# Patient Record
Sex: Female | Born: 1940 | Race: White | Hispanic: No | Smoking: Never smoker
Health system: Southern US, Community
[De-identification: ages and names within clinical notes are randomized; demographics above are authoritative.]

## PROBLEM LIST (undated history)

## (undated) DIAGNOSIS — M47816 Spondylosis without myelopathy or radiculopathy, lumbar region: Secondary | ICD-10-CM

## (undated) DIAGNOSIS — F419 Anxiety disorder, unspecified: Secondary | ICD-10-CM

## (undated) DIAGNOSIS — B019 Varicella without complication: Secondary | ICD-10-CM

## (undated) DIAGNOSIS — R202 Paresthesia of skin: Secondary | ICD-10-CM

## (undated) DIAGNOSIS — G629 Polyneuropathy, unspecified: Secondary | ICD-10-CM

## (undated) DIAGNOSIS — S76319A Strain of muscle, fascia and tendon of the posterior muscle group at thigh level, unspecified thigh, initial encounter: Secondary | ICD-10-CM

## (undated) DIAGNOSIS — L989 Disorder of the skin and subcutaneous tissue, unspecified: Secondary | ICD-10-CM

## (undated) DIAGNOSIS — M7122 Synovial cyst of popliteal space [Baker], left knee: Secondary | ICD-10-CM

## (undated) DIAGNOSIS — T7840XA Allergy, unspecified, initial encounter: Secondary | ICD-10-CM

## (undated) DIAGNOSIS — M1990 Unspecified osteoarthritis, unspecified site: Secondary | ICD-10-CM

## (undated) DIAGNOSIS — D369 Benign neoplasm, unspecified site: Secondary | ICD-10-CM

## (undated) DIAGNOSIS — R011 Cardiac murmur, unspecified: Secondary | ICD-10-CM

## (undated) DIAGNOSIS — N189 Chronic kidney disease, unspecified: Secondary | ICD-10-CM

## (undated) DIAGNOSIS — C4492 Squamous cell carcinoma of skin, unspecified: Secondary | ICD-10-CM

## (undated) DIAGNOSIS — N200 Calculus of kidney: Secondary | ICD-10-CM

## (undated) DIAGNOSIS — N261 Atrophy of kidney (terminal): Secondary | ICD-10-CM

## (undated) DIAGNOSIS — K5900 Constipation, unspecified: Secondary | ICD-10-CM

## (undated) DIAGNOSIS — Q615 Medullary cystic kidney: Secondary | ICD-10-CM

## (undated) DIAGNOSIS — M8588 Other specified disorders of bone density and structure, other site: Secondary | ICD-10-CM

## (undated) DIAGNOSIS — K635 Polyp of colon: Secondary | ICD-10-CM

## (undated) DIAGNOSIS — G459 Transient cerebral ischemic attack, unspecified: Secondary | ICD-10-CM

## (undated) DIAGNOSIS — G4700 Insomnia, unspecified: Secondary | ICD-10-CM

## (undated) DIAGNOSIS — E079 Disorder of thyroid, unspecified: Secondary | ICD-10-CM

## (undated) DIAGNOSIS — M797 Fibromyalgia: Secondary | ICD-10-CM

## (undated) HISTORY — DX: Calculus of kidney: N20.0

## (undated) HISTORY — DX: Synovial cyst of popliteal space (Baker), left knee: M71.22

## (undated) HISTORY — DX: Constipation, unspecified: K59.00

## (undated) HISTORY — DX: Polyp of colon: K63.5

## (undated) HISTORY — DX: Fibromyalgia: M79.7

## (undated) HISTORY — DX: Unspecified osteoarthritis, unspecified site: M19.90

## (undated) HISTORY — PX: VEIN LIGATION: SHX2652

## (undated) HISTORY — DX: Paresthesia of skin: R20.2

## (undated) HISTORY — PX: COLONOSCOPY: SHX174

## (undated) HISTORY — DX: Anxiety disorder, unspecified: F41.9

## (undated) HISTORY — DX: Spondylosis without myelopathy or radiculopathy, lumbar region: M47.816

## (undated) HISTORY — PX: DILATION AND CURETTAGE OF UTERUS: SHX78

## (undated) HISTORY — DX: Varicella without complication: B01.9

## (undated) HISTORY — PX: EYE SURGERY: SHX253

## (undated) HISTORY — DX: Allergy, unspecified, initial encounter: T78.40XA

## (undated) HISTORY — DX: Disorder of thyroid, unspecified: E07.9

## (undated) HISTORY — DX: Other specified disorders of bone density and structure, other site: M85.88

## (undated) HISTORY — DX: Polyneuropathy, unspecified: G62.9

## (undated) HISTORY — DX: Strain of muscle, fascia and tendon of the posterior muscle group at thigh level, unspecified thigh, initial encounter: S76.319A

## (undated) HISTORY — DX: Atrophy of kidney (terminal): N26.1

## (undated) HISTORY — DX: Medullary cystic kidney: Q61.5

## (undated) HISTORY — DX: Squamous cell carcinoma of skin, unspecified: C44.92

## (undated) HISTORY — DX: Cardiac murmur, unspecified: R01.1

## (undated) HISTORY — PX: AUGMENTATION MAMMAPLASTY: SUR837

## (undated) HISTORY — DX: Transient cerebral ischemic attack, unspecified: G45.9

## (undated) HISTORY — DX: Disorder of the skin and subcutaneous tissue, unspecified: L98.9

## (undated) HISTORY — DX: Benign neoplasm, unspecified site: D36.9

---

## 1997-12-01 ENCOUNTER — Other Ambulatory Visit: Admission: RE | Admit: 1997-12-01 | Discharge: 1997-12-01 | Payer: Self-pay | Admitting: *Deleted

## 1999-01-22 ENCOUNTER — Ambulatory Visit (HOSPITAL_COMMUNITY): Admission: RE | Admit: 1999-01-22 | Discharge: 1999-01-22 | Payer: Self-pay | Admitting: Neurosurgery

## 1999-01-22 ENCOUNTER — Encounter: Payer: Self-pay | Admitting: Neurosurgery

## 1999-03-13 ENCOUNTER — Other Ambulatory Visit: Admission: RE | Admit: 1999-03-13 | Discharge: 1999-03-13 | Payer: Self-pay | Admitting: *Deleted

## 1999-03-28 ENCOUNTER — Encounter: Payer: Self-pay | Admitting: Neurosurgery

## 1999-03-28 ENCOUNTER — Ambulatory Visit (HOSPITAL_COMMUNITY): Admission: RE | Admit: 1999-03-28 | Discharge: 1999-03-28 | Payer: Self-pay | Admitting: Neurosurgery

## 1999-04-11 ENCOUNTER — Encounter: Payer: Self-pay | Admitting: Neurosurgery

## 1999-04-11 ENCOUNTER — Ambulatory Visit (HOSPITAL_COMMUNITY): Admission: RE | Admit: 1999-04-11 | Discharge: 1999-04-11 | Payer: Self-pay | Admitting: Neurosurgery

## 1999-04-27 ENCOUNTER — Ambulatory Visit (HOSPITAL_COMMUNITY): Admission: RE | Admit: 1999-04-27 | Discharge: 1999-04-27 | Payer: Self-pay | Admitting: Neurosurgery

## 1999-04-27 ENCOUNTER — Encounter: Payer: Self-pay | Admitting: Neurosurgery

## 1999-11-06 ENCOUNTER — Other Ambulatory Visit: Admission: RE | Admit: 1999-11-06 | Discharge: 1999-11-06 | Payer: Self-pay | Admitting: Internal Medicine

## 1999-11-15 ENCOUNTER — Encounter: Admission: RE | Admit: 1999-11-15 | Discharge: 1999-11-15 | Payer: Self-pay | Admitting: Family Medicine

## 1999-11-15 ENCOUNTER — Encounter: Payer: Self-pay | Admitting: Internal Medicine

## 2000-04-18 ENCOUNTER — Encounter: Payer: Self-pay | Admitting: Internal Medicine

## 2000-04-18 ENCOUNTER — Encounter: Admission: RE | Admit: 2000-04-18 | Discharge: 2000-04-18 | Payer: Self-pay | Admitting: Internal Medicine

## 2000-04-23 ENCOUNTER — Encounter: Payer: Self-pay | Admitting: Internal Medicine

## 2000-04-23 ENCOUNTER — Encounter: Admission: RE | Admit: 2000-04-23 | Discharge: 2000-04-23 | Payer: Self-pay | Admitting: Internal Medicine

## 2001-02-12 ENCOUNTER — Other Ambulatory Visit: Admission: RE | Admit: 2001-02-12 | Discharge: 2001-02-12 | Payer: Self-pay | Admitting: Family Medicine

## 2001-04-02 ENCOUNTER — Ambulatory Visit (HOSPITAL_COMMUNITY): Admission: RE | Admit: 2001-04-02 | Discharge: 2001-04-02 | Payer: Self-pay | Admitting: Gastroenterology

## 2001-04-02 ENCOUNTER — Encounter (INDEPENDENT_AMBULATORY_CARE_PROVIDER_SITE_OTHER): Payer: Self-pay | Admitting: Specialist

## 2003-05-18 ENCOUNTER — Encounter: Admission: RE | Admit: 2003-05-18 | Discharge: 2003-05-18 | Payer: Self-pay | Admitting: Internal Medicine

## 2004-02-21 ENCOUNTER — Encounter: Admission: RE | Admit: 2004-02-21 | Discharge: 2004-02-21 | Payer: Self-pay | Admitting: Family Medicine

## 2004-03-08 ENCOUNTER — Encounter (HOSPITAL_COMMUNITY): Admission: RE | Admit: 2004-03-08 | Discharge: 2004-06-06 | Payer: Self-pay | Admitting: Family Medicine

## 2004-03-23 ENCOUNTER — Ambulatory Visit (HOSPITAL_COMMUNITY): Admission: RE | Admit: 2004-03-23 | Discharge: 2004-03-23 | Payer: Self-pay | Admitting: Family Medicine

## 2004-03-23 ENCOUNTER — Encounter (INDEPENDENT_AMBULATORY_CARE_PROVIDER_SITE_OTHER): Payer: Self-pay | Admitting: *Deleted

## 2004-04-12 ENCOUNTER — Ambulatory Visit: Payer: Self-pay

## 2005-11-22 ENCOUNTER — Encounter: Admission: RE | Admit: 2005-11-22 | Discharge: 2005-11-22 | Payer: Self-pay | Admitting: Family Medicine

## 2005-11-28 ENCOUNTER — Encounter: Admission: RE | Admit: 2005-11-28 | Discharge: 2005-11-28 | Payer: Self-pay | Admitting: Family Medicine

## 2006-03-13 ENCOUNTER — Ambulatory Visit (HOSPITAL_COMMUNITY): Admission: RE | Admit: 2006-03-13 | Discharge: 2006-03-13 | Payer: Self-pay | Admitting: Neurosurgery

## 2007-01-14 ENCOUNTER — Encounter: Admission: RE | Admit: 2007-01-14 | Discharge: 2007-01-14 | Payer: Self-pay | Admitting: Family Medicine

## 2007-01-27 ENCOUNTER — Encounter: Admission: RE | Admit: 2007-01-27 | Discharge: 2007-01-27 | Payer: Self-pay | Admitting: Family Medicine

## 2008-03-03 ENCOUNTER — Encounter: Admission: RE | Admit: 2008-03-03 | Discharge: 2008-03-03 | Payer: Self-pay | Admitting: Family Medicine

## 2010-06-10 ENCOUNTER — Encounter: Payer: Self-pay | Admitting: Family Medicine

## 2010-06-12 ENCOUNTER — Encounter: Payer: Self-pay | Admitting: Family Medicine

## 2010-11-24 ENCOUNTER — Other Ambulatory Visit: Payer: Self-pay | Admitting: Internal Medicine

## 2010-11-24 DIAGNOSIS — Z1231 Encounter for screening mammogram for malignant neoplasm of breast: Secondary | ICD-10-CM

## 2010-12-06 ENCOUNTER — Ambulatory Visit
Admission: RE | Admit: 2010-12-06 | Discharge: 2010-12-06 | Disposition: A | Discharge status: Home / Self Care | Payer: Medicare Other | Source: Ambulatory Visit | Attending: Internal Medicine | Admitting: Internal Medicine

## 2010-12-06 DIAGNOSIS — Z1231 Encounter for screening mammogram for malignant neoplasm of breast: Secondary | ICD-10-CM

## 2011-01-25 ENCOUNTER — Other Ambulatory Visit: Payer: Self-pay | Admitting: Dermatology

## 2011-12-14 ENCOUNTER — Other Ambulatory Visit: Payer: Self-pay | Admitting: Internal Medicine

## 2011-12-14 DIAGNOSIS — Z9882 Breast implant status: Secondary | ICD-10-CM

## 2011-12-14 DIAGNOSIS — Z1231 Encounter for screening mammogram for malignant neoplasm of breast: Secondary | ICD-10-CM

## 2012-01-09 ENCOUNTER — Ambulatory Visit
Admission: RE | Admit: 2012-01-09 | Discharge: 2012-01-09 | Disposition: A | Discharge status: Home / Self Care | Payer: Medicare Other | Source: Ambulatory Visit | Attending: Internal Medicine | Admitting: Internal Medicine

## 2012-01-09 DIAGNOSIS — Z9882 Breast implant status: Secondary | ICD-10-CM

## 2012-01-09 DIAGNOSIS — Z1231 Encounter for screening mammogram for malignant neoplasm of breast: Secondary | ICD-10-CM

## 2012-03-25 ENCOUNTER — Other Ambulatory Visit: Payer: Self-pay | Admitting: Dermatology

## 2012-11-27 ENCOUNTER — Other Ambulatory Visit: Payer: Self-pay

## 2012-11-27 ENCOUNTER — Other Ambulatory Visit: Payer: Self-pay | Admitting: Internal Medicine

## 2012-11-27 DIAGNOSIS — Z1231 Encounter for screening mammogram for malignant neoplasm of breast: Secondary | ICD-10-CM

## 2013-01-26 ENCOUNTER — Encounter (HOSPITAL_BASED_OUTPATIENT_CLINIC_OR_DEPARTMENT_OTHER): Payer: Self-pay | Admitting: *Deleted

## 2013-01-27 ENCOUNTER — Encounter (HOSPITAL_BASED_OUTPATIENT_CLINIC_OR_DEPARTMENT_OTHER): Payer: Self-pay | Admitting: Orthopedic Surgery

## 2013-01-27 ENCOUNTER — Encounter (HOSPITAL_BASED_OUTPATIENT_CLINIC_OR_DEPARTMENT_OTHER): Payer: Self-pay | Admitting: Anesthesiology

## 2013-01-27 ENCOUNTER — Ambulatory Visit (HOSPITAL_BASED_OUTPATIENT_CLINIC_OR_DEPARTMENT_OTHER): Payer: Medicare Other | Admitting: Anesthesiology

## 2013-01-27 ENCOUNTER — Ambulatory Visit (HOSPITAL_BASED_OUTPATIENT_CLINIC_OR_DEPARTMENT_OTHER)
Admission: RE | Admit: 2013-01-27 | Discharge: 2013-01-27 | Disposition: A | Discharge status: Home / Self Care | Payer: Medicare Other | Source: Ambulatory Visit | Attending: Orthopedic Surgery | Admitting: Orthopedic Surgery

## 2013-01-27 ENCOUNTER — Encounter (HOSPITAL_BASED_OUTPATIENT_CLINIC_OR_DEPARTMENT_OTHER)
Admission: RE | Disposition: A | Discharge status: Home / Self Care | Payer: Self-pay | Source: Ambulatory Visit | Attending: Orthopedic Surgery

## 2013-01-27 DIAGNOSIS — X58XXXA Exposure to other specified factors, initial encounter: Secondary | ICD-10-CM | POA: Insufficient documentation

## 2013-01-27 DIAGNOSIS — Z79899 Other long term (current) drug therapy: Secondary | ICD-10-CM | POA: Insufficient documentation

## 2013-01-27 DIAGNOSIS — S61209A Unspecified open wound of unspecified finger without damage to nail, initial encounter: Secondary | ICD-10-CM | POA: Insufficient documentation

## 2013-01-27 DIAGNOSIS — IMO0002 Reserved for concepts with insufficient information to code with codable children: Secondary | ICD-10-CM | POA: Insufficient documentation

## 2013-01-27 DIAGNOSIS — Z1839 Other retained organic fragments: Secondary | ICD-10-CM | POA: Insufficient documentation

## 2013-01-27 DIAGNOSIS — M129 Arthropathy, unspecified: Secondary | ICD-10-CM | POA: Insufficient documentation

## 2013-01-27 DIAGNOSIS — G4700 Insomnia, unspecified: Secondary | ICD-10-CM | POA: Insufficient documentation

## 2013-01-27 DIAGNOSIS — Y929 Unspecified place or not applicable: Secondary | ICD-10-CM | POA: Insufficient documentation

## 2013-01-27 DIAGNOSIS — Q615 Medullary cystic kidney: Secondary | ICD-10-CM | POA: Insufficient documentation

## 2013-01-27 HISTORY — DX: Insomnia, unspecified: G47.00

## 2013-01-27 HISTORY — PX: INCISION AND DRAINAGE ABSCESS: SHX5864

## 2013-01-27 HISTORY — DX: Unspecified osteoarthritis, unspecified site: M19.90

## 2013-01-27 HISTORY — DX: Chronic kidney disease, unspecified: N18.9

## 2013-01-27 LAB — POCT HEMOGLOBIN-HEMACUE: Hemoglobin: 13.6 g/dL (ref 12.0–15.0)

## 2013-01-27 SURGERY — INCISION AND DRAINAGE, ABSCESS
Anesthesia: Regional | Site: Thumb | Laterality: Right | Wound class: Dirty or Infected

## 2013-01-27 MED ORDER — ONDANSETRON HCL 4 MG/2ML IJ SOLN: 4.0000 mg | Freq: Once | INTRAMUSCULAR | Status: DC | PRN

## 2013-01-27 MED ORDER — OXYCODONE HCL 5 MG/5ML PO SOLN: 5.0000 mg | Freq: Once | ORAL | Status: DC | PRN

## 2013-01-27 MED ORDER — BUPIVACAINE HCL (PF) 0.25 % IJ SOLN
INTRAMUSCULAR | Status: DC | PRN
  Administered 2013-01-27: 5 mL

## 2013-01-27 MED ORDER — FENTANYL CITRATE 0.05 MG/ML IJ SOLN: 50.0000 ug | INTRAMUSCULAR | Status: DC | PRN

## 2013-01-27 MED ORDER — OXYCODONE HCL 5 MG PO TABS: 5.0000 mg | ORAL_TABLET | Freq: Once | ORAL | Status: DC | PRN

## 2013-01-27 MED ORDER — MEPERIDINE HCL 25 MG/ML IJ SOLN: 6.2500 mg | INTRAMUSCULAR | Status: DC | PRN

## 2013-01-27 MED ORDER — LACTATED RINGERS IV SOLN
INTRAVENOUS | Status: DC
  Administered 2013-01-27: 11:00:00 via INTRAVENOUS

## 2013-01-27 MED ORDER — CEFAZOLIN SODIUM-DEXTROSE 2-3 GM-% IV SOLR
INTRAVENOUS | Status: DC | PRN
  Administered 2013-01-27: 2 g via INTRAVENOUS

## 2013-01-27 MED ORDER — HYDROCODONE-ACETAMINOPHEN 5-325 MG PO TABS: 1.0000 | ORAL_TABLET | Freq: Four times a day (QID) | ORAL | Status: DC | PRN

## 2013-01-27 MED ORDER — HYDROMORPHONE HCL PF 1 MG/ML IJ SOLN: 0.2500 mg | INTRAMUSCULAR | Status: DC | PRN

## 2013-01-27 MED ORDER — MIDAZOLAM HCL 5 MG/5ML IJ SOLN
INTRAMUSCULAR | Status: DC | PRN
  Administered 2013-01-27: 2 mg via INTRAVENOUS

## 2013-01-27 MED ORDER — PROPOFOL INFUSION 10 MG/ML OPTIME
INTRAVENOUS | Status: DC | PRN
  Administered 2013-01-27: 100 ug/kg/min via INTRAVENOUS

## 2013-01-27 MED ORDER — FENTANYL CITRATE 0.05 MG/ML IJ SOLN
INTRAMUSCULAR | Status: DC | PRN
  Administered 2013-01-27: 100 ug via INTRAVENOUS

## 2013-01-27 MED ORDER — MIDAZOLAM HCL 2 MG/2ML IJ SOLN: 1.0000 mg | INTRAMUSCULAR | Status: DC | PRN

## 2013-01-27 MED ORDER — PROPOFOL 10 MG/ML IV BOLUS
INTRAVENOUS | Status: DC | PRN
  Administered 2013-01-27: 20 mg via INTRAVENOUS

## 2013-01-27 MED ORDER — CEPHALEXIN 250 MG PO CAPS: 250.0000 mg | ORAL_CAPSULE | Freq: Four times a day (QID) | ORAL | Status: DC

## 2013-01-27 MED ORDER — LIDOCAINE HCL (PF) 0.5 % IJ SOLN
INTRAMUSCULAR | Status: DC | PRN
  Administered 2013-01-27: 30 mL via INTRAVENOUS

## 2013-01-27 MED ORDER — CHLORHEXIDINE GLUCONATE 4 % EX LIQD: 60.0000 mL | Freq: Once | CUTANEOUS | Status: DC

## 2013-01-27 SURGICAL SUPPLY — 54 items
BAG DECANTER FOR FLEXI CONT (MISCELLANEOUS) IMPLANT
BANDAGE GAUZE ELAST BULKY 4 IN (GAUZE/BANDAGES/DRESSINGS) IMPLANT
BLADE MINI RND TIP GREEN BEAV (BLADE) IMPLANT
BLADE SURG 15 STRL LF DISP TIS (BLADE) ×1 IMPLANT
BLADE SURG 15 STRL SS (BLADE) ×2
BNDG CMPR 9X4 STRL LF SNTH (GAUZE/BANDAGES/DRESSINGS)
BNDG COHESIVE 1X5 TAN STRL LF (GAUZE/BANDAGES/DRESSINGS) ×1 IMPLANT
BNDG COHESIVE 3X5 TAN STRL LF (GAUZE/BANDAGES/DRESSINGS) IMPLANT
BNDG ESMARK 4X9 LF (GAUZE/BANDAGES/DRESSINGS) IMPLANT
CHLORAPREP W/TINT 26ML (MISCELLANEOUS) ×2 IMPLANT
CLOTH BEACON ORANGE TIMEOUT ST (SAFETY) ×2 IMPLANT
CORDS BIPOLAR (ELECTRODE) ×2 IMPLANT
COVER MAYO STAND STRL (DRAPES) ×2 IMPLANT
COVER TABLE BACK 60X90 (DRAPES) ×2 IMPLANT
CUFF TOURNIQUET SINGLE 18IN (TOURNIQUET CUFF) ×1 IMPLANT
DRAPE EXTREMITY T 121X128X90 (DRAPE) ×2 IMPLANT
DRAPE SURG 17X23 STRL (DRAPES) ×2 IMPLANT
DRSG KUZMA FLUFF (GAUZE/BANDAGES/DRESSINGS) ×1 IMPLANT
GAUZE PACKING IODOFORM 1/4X5 (PACKING) ×1 IMPLANT
GAUZE XEROFORM 1X8 LF (GAUZE/BANDAGES/DRESSINGS) ×2 IMPLANT
GLOVE BIO SURGEON STRL SZ 6.5 (GLOVE) ×1 IMPLANT
GLOVE BIO SURGEON STRL SZ7 (GLOVE) ×1 IMPLANT
GLOVE BIOGEL PI IND STRL 7.0 (GLOVE) IMPLANT
GLOVE BIOGEL PI IND STRL 8 (GLOVE) IMPLANT
GLOVE BIOGEL PI IND STRL 8.5 (GLOVE) ×1 IMPLANT
GLOVE BIOGEL PI INDICATOR 7.0 (GLOVE) ×1
GLOVE BIOGEL PI INDICATOR 8 (GLOVE) ×1
GLOVE BIOGEL PI INDICATOR 8.5 (GLOVE) ×1
GLOVE ECLIPSE 7.5 STRL STRAW (GLOVE) ×1 IMPLANT
GLOVE SURG ORTHO 8.0 STRL STRW (GLOVE) ×2 IMPLANT
GOWN BRE IMP PREV XXLGXLNG (GOWN DISPOSABLE) ×2 IMPLANT
GOWN PREVENTION PLUS XLARGE (GOWN DISPOSABLE) ×2 IMPLANT
GOWN STRL REIN 2XL XLG LVL4 (GOWN DISPOSABLE) ×2 IMPLANT
LOOP VESSEL MAXI BLUE (MISCELLANEOUS) IMPLANT
NEEDLE 27GAX1X1/2 (NEEDLE) ×1 IMPLANT
NS IRRIG 1000ML POUR BTL (IV SOLUTION) ×2 IMPLANT
PACK BASIN DAY SURGERY FS (CUSTOM PROCEDURE TRAY) ×2 IMPLANT
PAD CAST 3X4 CTTN HI CHSV (CAST SUPPLIES) IMPLANT
PADDING CAST ABS 4INX4YD NS (CAST SUPPLIES) ×1
PADDING CAST ABS COTTON 4X4 ST (CAST SUPPLIES) ×1 IMPLANT
PADDING CAST COTTON 3X4 STRL (CAST SUPPLIES)
SPLINT PLASTER CAST XFAST 3X15 (CAST SUPPLIES) IMPLANT
SPLINT PLASTER XTRA FASTSET 3X (CAST SUPPLIES)
SPONGE GAUZE 4X4 12PLY (GAUZE/BANDAGES/DRESSINGS) ×2 IMPLANT
STOCKINETTE 4X48 STRL (DRAPES) ×2 IMPLANT
SUT VICRYL RAPID 5 0 P 3 (SUTURE) IMPLANT
SUT VICRYL RAPIDE 4/0 PS 2 (SUTURE) ×1 IMPLANT
SWAB COLLECTION DEVICE MRSA (MISCELLANEOUS) ×1 IMPLANT
SYR BULB 3OZ (MISCELLANEOUS) ×2 IMPLANT
SYR CONTROL 10ML LL (SYRINGE) ×1 IMPLANT
TOWEL OR 17X24 6PK STRL BLUE (TOWEL DISPOSABLE) ×2 IMPLANT
TUBE ANAEROBIC SPECIMEN COL (MISCELLANEOUS) ×1 IMPLANT
TUBE FEEDING 5FR 15 INCH (TUBING) IMPLANT
UNDERPAD 30X30 INCONTINENT (UNDERPADS AND DIAPERS) ×2 IMPLANT

## 2013-01-27 NOTE — H&P (Signed)
  Megan Lester is a 72 year old right hand dominant former patient retired from [employer] who comes in complaining of having a thorn in her right thumb a week ago. 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 She states the thumb has swollen up. She has an area of tenderness where she attempted to remove the splinter. She has been soaking it in Epson salts but it has not gotten any better. She complains of pain, redness.  PAST MEDICAL HISTORY: She has no known drug allergies. She is on Alprazolam, multivitamins, probiotics, ibuprofen, a stool softener. She relates no surgery.  FAMILY H ISTORY: Positive for heart disease, high BP and arthritis.  SOCIAL HISTORY: She does not smoke or drink. She is retired.  REVIEW OF SYSTEMS: Negative for 14 points.  Megan Lester is an 72 y.o. female.   Chief Complaint: Fb rt thumb HPI: see above  Past Medical History  Diagnosis Date  . Insomnia   . Arthritis   . 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 Eye surgery      both cataracts  . Dilation and curettage of uterus    . Colonoscopy    . Vein ligation      History reviewed. No pertinent family history. Social History:  reports that she has never smoked. She does not have any smokeless tobacco history on file. She reports that she does not drink alcohol or use illicit drugs.  Allergies: No Known Allergies  No prescriptions prior to admission    No results found for this or any previous visit (from the past 48 hour(s)).  No results found.   Pertinent items are noted in HPI.  Height 5\' 6"  (1.676 m), weight 115 lb (52.164 kg).  General appearance: alert, cooperative and appears stated age Head: Normocephalic, without obvious abnormality Neck: no JVD Resp: clear to auscultation bilaterally Cardio: regular rate and rhythm, S1, S2 normal, no murmur, click, rub or gallop GI: soft, non-tender; bowel  sounds normal; no masses,  no organomegaly Extremities: edema distal phalanx rt thumb Pulses: 2+ and symmetric Skin: Skin color, texture, turgor normal. No rashes or lesions Neurologic: Grossly normal Incision/Wound: na  Assessment/Plan X-rays are negative for discrete foreign body.  We would recommend exploration. This is scheduled as an outpatient under regional anesthesia.  Alonza Knisley R 01/27/2013, 9:34 AM

## 2013-01-27 NOTE — Brief Op Note (Signed)
01/27/2013  11:17 AM  PATIENT:  Nechama Guard Meriweather  72 y.o. female  PRE-OPERATIVE DIAGNOSIS:  PROBABLE RETAINED FOREIGN BODY RIGHT THUMB PULP  POST-OPERATIVE DIAGNOSIS:  foreign body right thumb pulp  PROCEDURE:  Procedure(s): INCISION AND DRAINAGE RIGHT THUMB PULP (Right)  SURGEON:  Surgeon(s) and Role:    * Nicki Reaper, MD - Primary    * Tami Ribas, MD - Assisting  PHYSICIAN ASSISTANT:   ASSISTANTS: K Alroy Portela,MD   ANESTHESIA:   local and regional  EBL:     BLOOD ADMINISTERED:none  DRAINS: none   LOCAL MEDICATIONS USED:  MARCAINE     SPECIMEN:  Source of Specimen:  culture  DISPOSITION OF SPECIMEN:  PATHOLOGY  COUNTS:  YES  TOURNIQUET:   Total Tourniquet Time Documented: Forearm (Right) - 19 minutes Total: Forearm (Right) - 19 minutes   DICTATION: .Other Dictation: Dictation Number [id number]  PLAN OF CARE: Discharge to home after PACU  PATIENT DISPOSITION:  PACU - hemodynamically stable.

## 2013-01-27 NOTE — Op Note (Signed)
Dictation Number [id number]

## 2013-01-27 NOTE — Anesthesia Preprocedure Evaluation (Addendum)

## 2013-01-27 NOTE — Anesthesia Postprocedure Evaluation (Signed)
Anesthesia Post Note  Patient: Megan Lester  Procedure(s) Performed: Procedure(s) (LRB): INCISION AND DRAINAGE RIGHT THUMB PULP (Right)  Anesthesia type: MAC  Patient location: PACU  Post pain: Pain level controlled  Post assessment: Patient's Cardiovascular Status Stable  Last Vitals:  Filed Vitals:   01/27/13 1203  BP: 135/76  Pulse: 59  Temp: 36.6 C  Resp: 18    Post vital signs: Reviewed and stable  Level of consciousness: sedated  Complications: No apparent anesthesia complications

## 2013-01-27 NOTE — Transfer of Care (Signed)
Immediate Anesthesia Transfer of Care Note  Patient: Megan Lester  Procedure(s) Performed: Procedure(s): INCISION AND DRAINAGE RIGHT THUMB PULP (Right)  Patient Location: PACU  Anesthesia Type:Bier block  Level of Consciousness: awake, alert  and patient cooperative  Airway & Oxygen Therapy: Patient Spontanous Breathing and Patient connected to face mask oxygen  Post-op Assessment: Report given to PACU RN, Post -op Vital signs reviewed and stable and Patient moving all extremities  Post vital signs: Reviewed and stable  Complications: No apparent anesthesia complications

## 2013-01-28 ENCOUNTER — Encounter (HOSPITAL_BASED_OUTPATIENT_CLINIC_OR_DEPARTMENT_OTHER): Payer: Self-pay | Admitting: Orthopedic Surgery

## 2013-01-28 NOTE — Op Note (Signed)
Megan Lester, TREIBER NO.:  [id number]  MEDICAL RECORD NO.:  [id number]  LOCATION:                               FACILITY:  [facility]  PHYSICIAN:  Cindee Salt, M.D.       DATE OF BIRTH:  1940/10/12  DATE OF PROCEDURE:  01/27/2013 DATE OF DISCHARGE:  01/27/2013                              OPERATIVE REPORT   PREOPERATIVE DIAGNOSIS:  Foreign body felon right thumb.  POSTOPERATIVE DIAGNOSIS:  Foreign body felon right thumb.  OPERATION:  Excision of foreign body with open packing, felon right thumb.  SURGEON:  Cindee Salt, M.D.  ANESTHESIA:  Forearm-based IV regional with metacarpal block.  ANESTHESIOLOGIST:  Kaylyn Layer. Michelle Piper, M.D.  HISTORY:  The patient is a 72 year old female with a history of a puncture wound to her right thumb.  This was approximately 9-week-old, has had continued pain and swelling with the possibility of retained foreign body.  We have discussed exploration release with possible packing.  Pre, peri, and postoperative course have been discussed along with risks and complications.  She is aware that there is no guarantee with the surgery; possibility of infection; recurrence of injury to arteries, nerves, tendons, incomplete relief of symptoms, and dystrophy. In the preoperative area, the patient was seen, the extremity marked by both patient and surgeon.  DESCRIPTION OF PROCEDURE:  The patient was brought to the operating room where a forearm IV regional anesthetic was carried out without difficulty.  She was prepped using ChloraPrep, supine position with the right arm free.  A 3-minute dry time was allowed.  Time-out taken, confirming the patient and procedure.  A metacarpal block was given with 0.25% Marcaine without epinephrine, 6 mL was used.  An incision was made directly over the entrance wound, carried down through subcutaneous tissue.  The area was spread opened and a piece of thorn tip of approximately 4 mm in length was immediately  encountered.  This was removed.  Cultures were taken for both aerobic and anaerobic cultures. Wound was copiously irrigated with saline.  The felon was opened with blunt dissection.  Cutting septum across taking care to protect neurovascular bundles.  This was then irrigated and packed with Iodoform gauze.  Sterile compressive dressing and splint to the thumb applied. Deflation of the tourniquet, all fingers immediately pink.  She was taken to the recovery room for observation in satisfactory condition.  She was given Ancef in the operating room.  Following the incision and drainage, stopping of the tourniquet.  She will be discharged home to return in 1 week on Keflex and Norco.    ______________________________ Cindee Salt, M.D.   ______________________________ Cindee Salt, M.D.    GK/MEDQ  D:  01/27/2013  T:  01/27/2013  Job:  [id number]

## 2013-01-30 LAB — WOUND CULTURE: Culture: NO GROWTH

## 2013-02-01 LAB — ANAEROBIC CULTURE

## 2013-02-16 ENCOUNTER — Other Ambulatory Visit: Payer: Self-pay | Admitting: Dermatology

## 2013-03-11 LAB — AFB CULTURE WITH SMEAR (NOT AT ARMC)

## 2013-03-26 ENCOUNTER — Ambulatory Visit
Admission: RE | Admit: 2013-03-26 | Discharge: 2013-03-26 | Disposition: A | Discharge status: Home / Self Care | Payer: Medicare Other | Source: Ambulatory Visit

## 2013-03-26 DIAGNOSIS — Z1231 Encounter for screening mammogram for malignant neoplasm of breast: Secondary | ICD-10-CM

## 2013-04-27 ENCOUNTER — Other Ambulatory Visit: Payer: Self-pay | Admitting: Dermatology

## 2013-11-05 ENCOUNTER — Other Ambulatory Visit: Payer: Self-pay | Admitting: Dermatology

## 2014-02-26 ENCOUNTER — Other Ambulatory Visit: Payer: Self-pay | Admitting: Dermatology

## 2014-05-21 HISTORY — PX: OTHER SURGICAL HISTORY: SHX169

## 2014-08-09 ENCOUNTER — Other Ambulatory Visit: Payer: Self-pay | Admitting: Dermatology

## 2014-11-09 ENCOUNTER — Other Ambulatory Visit: Payer: Self-pay | Admitting: *Deleted

## 2014-11-09 DIAGNOSIS — R202 Paresthesia of skin: Secondary | ICD-10-CM

## 2014-11-23 ENCOUNTER — Ambulatory Visit (INDEPENDENT_AMBULATORY_CARE_PROVIDER_SITE_OTHER): Payer: Medicare Other | Admitting: Neurology

## 2014-11-23 DIAGNOSIS — R202 Paresthesia of skin: Secondary | ICD-10-CM

## 2014-11-23 DIAGNOSIS — M5417 Radiculopathy, lumbosacral region: Secondary | ICD-10-CM

## 2014-11-23 NOTE — Procedures (Signed)
[facility]  [place], [place]  [place], [place] [address] Tel: [phone] Fax:  [phone] Test Date:  11/23/2014  Patient: Megan Lester DOB: 1941-05-21 Physician: Narda Amber  Sex: Female Height: 5\' 6"  Ref Phys: Irven Shelling  ID#: [id number] Temp: 33.2C Technician: Jerilynn Mages. Dean   Patient Complaints: This is a 74 year old female presenting for evaluation of bilateral feet paresthesias and chronic low back pain.   NCV & EMG Findings: Extensive electrodiagnostic testing of the right lower extremity and additional studies of the left shows:  1. Bilateral sural and superficial peroneal sensory responses are within normal limits. 2. Right peroneal motor nerve showed reduced amplitude (0.5 mV) recording at the extensor digitorum brevis, however the motor response recording at the tibialis anterior is within normal limits. This finding may be due to localized injury as there is a prominent bone spur located over the dorsum of the foot. Bilateral tibial and left peroneal motor responses are within normal limits. 3. Bilateral H reflex studies are mildly prolonged. 4. Chronic motor axon loss changes are seen in L5-S1 myotomes bilaterally, without accompanied active denervation.  Impression: 1. 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 2. There is no evidence of a generalized sensorimotor polyneuropathy.   ___________________________ Narda Amber    Nerve Conduction Studies Anti Sensory Summary Table   Stim Site NR Peak (ms) Norm Peak (ms) P-T Amp (V) Norm P-T Amp  Left Sup Peroneal Anti Sensory (Ant Lat Mall)  12 cm    3.8 <4.6 6.3 >3  Right Sup Peroneal Anti Sensory (Ant Lat Mall)  12 cm    3.3 <4.6 4.9 >3  Left Sural Anti Sensory (Lat Mall)  Calf    3.6 <4.6 5.4 >3  Right Sural Anti Sensory (Lat Mall)  Calf    4.1 <4.6 3.6 >3   Motor Summary Table   Stim Site NR Onset (ms) Norm Onset  (ms) O-P Amp (mV) Norm O-P Amp Site1 Site2 Delta-0 (ms) Dist (cm) Vel (m/s) Norm Vel (m/s)  Left Peroneal Motor (Ext Dig Brev)  Ankle    4.8 <6.0 2.6 >2.5 B Fib Ankle 6.8 30.0 44 >40  B Fib    11.6  2.6  Poplt B Fib 1.9 10.0 53 >40  Poplt    13.5  2.4         Right Peroneal Motor (Ext Dig Brev)  Ankle    3.8 <6.0 0.5 >2.5 B Fib Ankle 7.7 32.0 42 >40  B Fib    11.5  0.5  Poplt B Fib 2.2 10.0 45 >40  Poplt    13.7  0.5         Right Peroneal TA Motor (Tib Ant)  Fib Head    3.9 <4.5 5.4 >3 Poplit Fib Head 1.8 8.0 44 >40  Poplit    5.7  5.4         Left Tibial Motor (Abd Hall Brev)  Ankle    3.4 <6.0 4.5 >4 Knee Ankle 9.8 42.0 43 >40  Knee    13.2  4.4         Right Tibial Motor (Abd Hall Brev)  Ankle    3.7 <6.0 6.9 >4 Knee Ankle 9.5 42.0 44 >40  Knee    13.2  4.3          H Reflex Studies   NR H-Lat (ms) Lat Norm (ms) L-R H-Lat (ms)  Left Tibial (Gastroc)  36.05 <35 0.54  Right Tibial (Gastroc)     35.51 <35 0.54   EMG   Side Muscle Ins Act Fibs Psw Fasc Number Recrt Dur Dur. Amp Amp. Poly Poly. Comment  Right RectFemoris Nml Nml Nml Nml Nml Nml Nml Nml Nml Nml Nml Nml N/A  Right Gastroc Nml Nml Nml Nml 1- Rapid Some 1+ Some 1+ Nml Nml N/A  Right GluteusMed Nml Nml Nml Nml 1- Mod-R Some 1+ Few 1+ Nml Nml N/A  Right Flex Dig Long Nml Nml Nml Nml 1- Mod-R Some 1+ Some 1+ Nml Nml N/A  Right BicepsFemS Nml Nml Nml Nml 1- Mod-R Few 1+ Few 1+ Nml Nml N/A  Right AntTibialis Nml Nml Nml Nml 1- Rapid Some 1+ Some 1+ Nml Nml N/A  Left RectFemoris Nml Nml Nml Nml Nml Nml Nml Nml Nml Nml Nml Nml N/A  Left BicepsFemS Nml Nml Nml Nml 1- Mod-R Some 1+ Some 1+ Nml Nml N/A  Left Flex Dig Long Nml Nml Nml Nml 1- Rapid Some 1+ Some 1+ Nml Nml N/A  Left AntTibialis Nml Nml Nml Nml 1- Mod-R Some 1+ Some 1+ Nml Nml N/A  Left Gastroc Nml Nml Nml Nml 1- Rapid Some 1+ Some 1+ Nml Nml N/A  Left GluteusMed Nml Nml Nml Nml 1- Mod-R Some 1+ Some 1+ Nml Nml N/A      Waveforms:

## 2014-11-30 ENCOUNTER — Telehealth: Payer: Self-pay | Admitting: *Deleted

## 2014-11-30 NOTE — Telephone Encounter (Signed)
Patient had EMG done on July 5.  She called Dr. Delene Ruffini office for the results and they said that they do not have them.  I will re-fax these notes.

## 2014-12-07 ENCOUNTER — Telehealth: Payer: Self-pay | Admitting: Neurology

## 2014-12-07 NOTE — Telephone Encounter (Signed)
Results faxed.

## 2014-12-07 NOTE — Telephone Encounter (Signed)
Blanch Media from Dr Delene Ruffini office called and wanted to know if you could fax the results for pt from her nerve core test report/Dawn CB# [phone] Fax# [phone]

## 2015-05-19 ENCOUNTER — Encounter: Payer: Self-pay | Admitting: Gastroenterology

## 2015-06-16 ENCOUNTER — Other Ambulatory Visit: Payer: Self-pay

## 2015-06-16 DIAGNOSIS — N261 Atrophy of kidney (terminal): Secondary | ICD-10-CM | POA: Diagnosis not present

## 2015-06-16 DIAGNOSIS — Z1389 Encounter for screening for other disorder: Secondary | ICD-10-CM | POA: Diagnosis not present

## 2015-06-16 DIAGNOSIS — Z5181 Encounter for therapeutic drug level monitoring: Secondary | ICD-10-CM | POA: Diagnosis not present

## 2015-06-16 DIAGNOSIS — M1288 Other specific arthropathies, not elsewhere classified, other specified site: Secondary | ICD-10-CM | POA: Diagnosis not present

## 2015-06-16 DIAGNOSIS — Z1231 Encounter for screening mammogram for malignant neoplasm of breast: Secondary | ICD-10-CM

## 2015-06-16 DIAGNOSIS — Z0000 Encounter for general adult medical examination without abnormal findings: Secondary | ICD-10-CM | POA: Diagnosis not present

## 2015-06-16 DIAGNOSIS — E7800 Pure hypercholesterolemia, unspecified: Secondary | ICD-10-CM | POA: Diagnosis not present

## 2015-06-16 DIAGNOSIS — K5900 Constipation, unspecified: Secondary | ICD-10-CM | POA: Diagnosis not present

## 2015-06-17 DIAGNOSIS — M4726 Other spondylosis with radiculopathy, lumbar region: Secondary | ICD-10-CM | POA: Diagnosis not present

## 2015-06-17 DIAGNOSIS — M4806 Spinal stenosis, lumbar region: Secondary | ICD-10-CM | POA: Diagnosis not present

## 2015-06-17 DIAGNOSIS — M5136 Other intervertebral disc degeneration, lumbar region: Secondary | ICD-10-CM | POA: Diagnosis not present

## 2015-06-17 DIAGNOSIS — M4316 Spondylolisthesis, lumbar region: Secondary | ICD-10-CM | POA: Diagnosis not present

## 2015-07-06 ENCOUNTER — Ambulatory Visit
Admission: RE | Admit: 2015-07-06 | Discharge: 2015-07-06 | Disposition: A | Discharge status: Home / Self Care | Payer: PPO | Source: Ambulatory Visit

## 2015-07-06 DIAGNOSIS — Z1231 Encounter for screening mammogram for malignant neoplasm of breast: Secondary | ICD-10-CM

## 2015-07-08 DIAGNOSIS — H0011 Chalazion right upper eyelid: Secondary | ICD-10-CM | POA: Diagnosis not present

## 2015-07-08 DIAGNOSIS — Z961 Presence of intraocular lens: Secondary | ICD-10-CM | POA: Diagnosis not present

## 2015-07-08 DIAGNOSIS — H43811 Vitreous degeneration, right eye: Secondary | ICD-10-CM | POA: Diagnosis not present

## 2015-07-08 DIAGNOSIS — H26493 Other secondary cataract, bilateral: Secondary | ICD-10-CM | POA: Diagnosis not present

## 2015-07-08 DIAGNOSIS — H16223 Keratoconjunctivitis sicca, not specified as Sjogren's, bilateral: Secondary | ICD-10-CM | POA: Diagnosis not present

## 2015-07-11 DIAGNOSIS — L821 Other seborrheic keratosis: Secondary | ICD-10-CM | POA: Diagnosis not present

## 2015-07-11 DIAGNOSIS — L218 Other seborrheic dermatitis: Secondary | ICD-10-CM | POA: Diagnosis not present

## 2015-07-11 DIAGNOSIS — Z85828 Personal history of other malignant neoplasm of skin: Secondary | ICD-10-CM | POA: Diagnosis not present

## 2015-07-11 DIAGNOSIS — L570 Actinic keratosis: Secondary | ICD-10-CM | POA: Diagnosis not present

## 2015-07-11 DIAGNOSIS — D692 Other nonthrombocytopenic purpura: Secondary | ICD-10-CM | POA: Diagnosis not present

## 2015-07-18 ENCOUNTER — Ambulatory Visit (INDEPENDENT_AMBULATORY_CARE_PROVIDER_SITE_OTHER): Payer: PPO | Admitting: Gastroenterology

## 2015-07-18 ENCOUNTER — Encounter: Payer: Self-pay | Admitting: Gastroenterology

## 2015-07-18 VITALS — BP 120/60 | HR 74 | Ht 66.0 in | Wt 122.8 lb

## 2015-07-18 DIAGNOSIS — K5900 Constipation, unspecified: Secondary | ICD-10-CM | POA: Diagnosis not present

## 2015-07-18 DIAGNOSIS — D126 Benign neoplasm of colon, unspecified: Secondary | ICD-10-CM

## 2015-07-18 DIAGNOSIS — K635 Polyp of colon: Secondary | ICD-10-CM | POA: Diagnosis not present

## 2015-07-18 MED ORDER — NA SULFATE-K SULFATE-MG SULF 17.5-3.13-1.6 GM/177ML PO SOLN: 1.0000 | Freq: Once | ORAL | Status: DC

## 2015-07-18 NOTE — Patient Instructions (Addendum)
You will be set up for a colonoscopy for polyp surveillance 

## 2015-07-18 NOTE — Progress Notes (Signed)
HPI: [name]    who was referred to me by Megan Orn, MD  to evaluate  personal history of precancerous polyps, chronic mild constipation .      2002 path report from colonoscopy:  I do not have a copy of the colonoscopy report at the time of this visit however. 1. COLON: POLYPOID COLONIC MUCOSA. NO ADENOMATOUS CHANGE OR MALIGNANCY IDENTIFIED. 2. COLON, POLYP: TUBULOVILLOUS ADENOMA. NO HIGH GRADE DYSPLASIA OR MALIGNANCY IDENTIFIED.  Colonoscopy report Megan Lester 05/2009: internal hemorrhoids, no polyps,  He recommended repeat colonoscopy in 5 years.  Generally constipated. She excercises and stays hydrated.  She takes miralax every day and she is very regular with it.  Minor overt bleeding months ago, only once.  Hemorrhoid that is not bothersome.  No FH of colon cancer.  Overall lost a lot of weight after death of husband, daughter   Chief complaint is personal history of precancerous polyps, mild constipation  Review of systems: Pertinent positive and negative review of systems were noted in the above HPI section. Complete review of systems was performed and was otherwise normal.   Past Medical History  Diagnosis Date  . Insomnia   . Arthritis   . 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 Anxiety   . Tubular adenoma   . Thyroid disease     Past Surgical History  Procedure Laterality Date  . Eye surgery      both cataracts  . Dilation and curettage of uterus    . Colonoscopy    . Vein ligation    . Incision and drainage abscess Right 01/27/2013    Procedure: INCISION AND DRAINAGE RIGHT THUMB PULP;  Surgeon: Megan Sours, MD;  Location: [facility];  Service: Orthopedics;  Laterality: Right;    Current Outpatient Prescriptions  Medication Sig Dispense Refill  . ALPRAZolam (XANAX) 0.5 MG tablet Take 0.5 mg by mouth at bedtime as needed for sleep.    . Calcium  Carbonate-Vitamin D (CALCIUM-VITAMIN D) 500-200 MG-UNIT per tablet Take 1 tablet by mouth 2 (two) times daily with a meal.    . diclofenac (VOLTAREN) 75 MG EC tablet Take 75 mg by mouth daily.    Marland Kitchen omeprazole (PRILOSEC) 20 MG capsule Take 20 mg by mouth daily.     No current facility-administered medications for this visit.    Allergies as of 07/18/2015  . (No Known Allergies)    Family History  Problem Relation Age of Onset  . Diabetes Mother   . Diabetes Brother   . Diabetes Sister     x 2  . Heart disease Mother   . Heart disease Father   . Heart disease Brother     x 3   . Heart disease Sister     x 4  . Colon cancer Neg Hx   . Lung cancer Brother     smoker    Social History   Social History  . Marital Status: Widowed    Spouse Name: N/A  . Number of Children: 1  . Years of Education: N/A   Occupational History  . retired    Social History Main Topics  . Smoking status: Never Smoker   . Smokeless tobacco: Never Used  . Alcohol Use: No  . Drug Use: No  . Sexual Activity: Not on file   Other Topics Concern  . Not on file  Social History Narrative     Physical Exam: BP 120/60 mmHg  Pulse 74  Ht 5\' 6"  (1.676 m)  Wt 122 lb 12.8 oz (55.702 kg)  BMI 19.83 kg/m2 Constitutional: generally well-appearing Psychiatric: alert and oriented x3 Eyes: extraocular movements intact Mouth: oral pharynx moist, no lesions Neck: supple no lymphadenopathy Cardiovascular: heart regular rate and rhythm Lungs: clear to auscultation bilaterally Abdomen: soft, nontender, nondistended, no obvious ascites, no peritoneal signs, normal bowel sounds Extremities: no lower extremity edema bilaterally Skin: no lesions on visible extremities   Assessment and plan: 75 y.o. 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 This is a high risk adenoma current guidelines recommend surveillance  colonoscopy every 5 years. Her last colonoscopy was in 2011 Think it is absolute reasonable to repeat that now. I explained to her that her mild constipation is very well treated with daily MiraLAX and she is safe to stay on that medicine indefinitely. I see no reason for any further blood tests or imaging studies.   Megan Loffler, MD [facility] 07/18/2015, 1:39 PM  Cc: Megan Orn, MD

## 2015-09-01 DIAGNOSIS — R202 Paresthesia of skin: Secondary | ICD-10-CM | POA: Diagnosis not present

## 2015-09-01 DIAGNOSIS — M791 Myalgia: Secondary | ICD-10-CM | POA: Diagnosis not present

## 2015-09-12 ENCOUNTER — Encounter: Payer: Self-pay | Admitting: Gastroenterology

## 2015-09-12 ENCOUNTER — Ambulatory Visit (AMBULATORY_SURGERY_CENTER): Payer: PPO | Admitting: Gastroenterology

## 2015-09-12 VITALS — BP 130/68 | HR 60 | Temp 97.2°F | Resp 16 | Ht 66.0 in | Wt 122.0 lb

## 2015-09-12 DIAGNOSIS — Z86010 Personal history of colonic polyps: Secondary | ICD-10-CM | POA: Diagnosis not present

## 2015-09-12 DIAGNOSIS — D125 Benign neoplasm of sigmoid colon: Secondary | ICD-10-CM

## 2015-09-12 DIAGNOSIS — D126 Benign neoplasm of colon, unspecified: Secondary | ICD-10-CM

## 2015-09-12 DIAGNOSIS — D122 Benign neoplasm of ascending colon: Secondary | ICD-10-CM

## 2015-09-12 DIAGNOSIS — K219 Gastro-esophageal reflux disease without esophagitis: Secondary | ICD-10-CM | POA: Diagnosis not present

## 2015-09-12 MED ORDER — SODIUM CHLORIDE 0.9 % IV SOLN: 500.0000 mL | INTRAVENOUS | Status: DC

## 2015-09-12 NOTE — Patient Instructions (Signed)
Discharge instructions given. Handout on polyps. Resume previous medications. YOU HAD AN ENDOSCOPIC PROCEDURE TODAY AT THE [facility]:   Refer to the procedure report that was given to you for any specific questions about what was found during the examination.  If the procedure report does not answer your questions, please call your gastroenterologist to clarify.  If you requested that your care partner not be given the details of your procedure findings, then the procedure report has been included in a sealed envelope for you to review at your convenience later.  YOU SHOULD EXPECT: Some feelings of bloating in the abdomen. Passage of more gas than usual.  Walking can help get rid of the air that was put into your GI tract during the procedure and reduce the bloating. If you had a lower endoscopy (such as a colonoscopy or flexible sigmoidoscopy) you may notice spotting of blood in your stool or on the toilet paper. If you underwent a bowel prep for your procedure, you may not have a normal bowel movement for a few days.  Please Note:  You might notice some irritation and congestion in your nose or some drainage.  This is from the oxygen used during your procedure.  There is no need for concern and it should clear up in a day or so.  SYMPTOMS TO REPORT IMMEDIATELY:   Following lower endoscopy (colonoscopy or flexible sigmoidoscopy):  Excessive amounts of blood in the stool  Significant tenderness or worsening of abdominal pains  Swelling of the abdomen that is new, acute  Fever of 100F or higher   For urgent or emergent issues, a gastroenterologist can be reached at any hour by calling [phone].   DIET: Your first meal following the procedure should be a small meal and then it is ok to progress to your normal diet. Heavy or fried foods are harder to digest and may make you feel nauseous or bloated.  Likewise, meals heavy in dairy and vegetables can increase bloating.  Drink  plenty of fluids but you should avoid alcoholic beverages for 24 hours.  ACTIVITY:  You should plan to take it easy for the rest of today and you should NOT DRIVE or use heavy machinery until tomorrow (because of the sedation medicines used during the test).    FOLLOW UP: Our staff will call the number listed on your records the next business day following your procedure to check on you and address any questions or concerns that you may have regarding the information given to you following your procedure. If we do not reach you, we will leave a message.  However, if you are feeling well and you are not experiencing any problems, there is no need to return our call.  We will assume that you have returned to your regular daily activities without incident.  If any biopsies were taken you will be contacted by phone or by letter within the next 1-3 weeks.  Please call us at [phone] if you have not heard about the biopsies in 3 weeks.    SIGNATURES/CONFIDENTIALITY: You and/or your care partner have signed paperwork which will be entered into your electronic medical record.  These signatures attest to the fact that that the information above on your After Visit Summary has been reviewed and is understood.  Full responsibility of the confidentiality of this discharge information lies with you and/or your care-partner. 

## 2015-09-12 NOTE — Op Note (Signed)
[facility] Patient Name: Megan Lester Procedure Date: 09/12/2015 1:20 PM MRN: [id number] Endoscopist: Milus Banister , MD Age: 75 Date of Birth: Apr 05, 1941 Gender: Female Procedure:                Colonoscopy Indications:              High risk colon cancer surveillance: Personal                            history of colonic polyps (TVA 2002) Medicines:                Monitored Anesthesia Care Procedure:                Pre-Anesthesia Assessment:                           - Prior to the procedure, a History and Physical                            was performed, and patient medications and                            allergies were reviewed. The patient's tolerance of                            previous anesthesia was also reviewed. The risks                            and benefits of the procedure and the sedation                            options and risks were discussed with the patient.                            All questions were answered, and informed consent                            was obtained. Prior Anticoagulants: The patient has                            taken no previous anticoagulant or antiplatelet                            agents. ASA Grade Assessment: II - A patient with                            mild systemic disease. After reviewing the risks                            and benefits, the patient was deemed in                            satisfactory condition to undergo the procedure.  After obtaining informed consent, the colonoscope                            was passed under direct vision. Throughout the                            procedure, the patient's blood pressure, pulse, and                            oxygen saturations were monitored continuously. The                            Model PCF-H190L [id number]) scope was introduced                            through the anus and advanced to the the cecum,              identified by appendiceal orifice and ileocecal                            valve. The colonoscopy was performed without                            difficulty. The patient tolerated the procedure                            well. The quality of the bowel preparation was                            excellent. The ileocecal valve, appendiceal                            orifice, and rectum were photographed. Scope In: 1:34:06 PM Scope Out: 1:49:27 PM Scope Withdrawal Time: 0 hours 8 minutes 47 seconds  Total Procedure Duration: 0 hours 15 minutes 21 seconds  Findings:                 Two sessile polyps were found in the sigmoid colon                            and ascending colon. The polyps were 3 to 5 mm in                            size. These polyps were removed with a cold snare.                            Resection and retrieval were complete.                           An area of mild melanosis was found in the entire                            colon.  The exam was otherwise without abnormality on                            direct and retroflexion views. Complications:            No immediate complications. Estimated blood loss:                            None. Estimated Blood Loss:     Estimated blood loss: none. Impression:               - Two 3 to 5 mm polyps in the sigmoid colon and in                            the ascending colon, removed with a cold snare.                            Resected and retrieved.                           - Melanosis in the colon.                           - The examination was otherwise normal on direct                            and retroflexion views. Recommendation:           - Patient has a contact number available for                            emergencies. The signs and symptoms of potential                            delayed complications were discussed with the                            patient. Return to  normal activities tomorrow.                            Written discharge instructions were provided to the                            patient.                           - Resume previous diet.                           - Continue present medications.                           You will receive a letter within 2-3 weeks with the                            pathology results and my final recommendations.  If the polyp(s) is proven to be 'pre-cancerous' on                            pathology, you will need repeat colonoscopy in 5                            years. Milus Banister, MD 09/12/2015 1:53:29 PM This report has been signed electronically.

## 2015-09-12 NOTE — Progress Notes (Signed)
Report to PACU, RN, vss, BBS= Clear.  

## 2015-09-12 NOTE — Progress Notes (Signed)
Called to room to assist during endoscopic procedure.  Patient ID and intended procedure confirmed with present staff. Received instructions for my participation in the procedure from the performing physician.  

## 2015-09-13 ENCOUNTER — Telehealth: Payer: Self-pay | Admitting: *Deleted

## 2015-09-13 NOTE — Telephone Encounter (Signed)
  Follow up Call-  Call back number 09/12/2015  Post procedure Call Back phone  # [phone]  Permission to leave phone message Yes     Patient questions:  Do you have a fever, pain , or abdominal swelling? No. Pain Score  0 *  Have you tolerated food without any problems? Yes.    Have you been able to return to your normal activities? Yes.    Do you have any questions about your discharge instructions: Diet   No. Medications  No. Follow up visit  No.  Do you have questions or concerns about your Care? No.  Actions: * If pain score is 4 or above: No action needed, pain <4.

## 2015-09-16 DIAGNOSIS — M5136 Other intervertebral disc degeneration, lumbar region: Secondary | ICD-10-CM | POA: Diagnosis not present

## 2015-09-16 DIAGNOSIS — M4316 Spondylolisthesis, lumbar region: Secondary | ICD-10-CM | POA: Diagnosis not present

## 2015-09-16 DIAGNOSIS — M4806 Spinal stenosis, lumbar region: Secondary | ICD-10-CM | POA: Diagnosis not present

## 2015-09-16 DIAGNOSIS — M4726 Other spondylosis with radiculopathy, lumbar region: Secondary | ICD-10-CM | POA: Diagnosis not present

## 2015-09-19 ENCOUNTER — Encounter: Payer: Self-pay | Admitting: Gastroenterology

## 2015-09-28 DIAGNOSIS — M791 Myalgia: Secondary | ICD-10-CM | POA: Diagnosis not present

## 2015-09-29 DIAGNOSIS — D485 Neoplasm of uncertain behavior of skin: Secondary | ICD-10-CM | POA: Diagnosis not present

## 2015-09-29 DIAGNOSIS — L570 Actinic keratosis: Secondary | ICD-10-CM | POA: Diagnosis not present

## 2015-09-29 DIAGNOSIS — Z85828 Personal history of other malignant neoplasm of skin: Secondary | ICD-10-CM | POA: Diagnosis not present

## 2015-10-04 ENCOUNTER — Other Ambulatory Visit (HOSPITAL_COMMUNITY)
Admission: RE | Admit: 2015-10-04 | Discharge: 2015-10-04 | Disposition: A | Discharge status: Home / Self Care | Payer: PPO | Source: Ambulatory Visit | Attending: Internal Medicine | Admitting: Internal Medicine

## 2015-10-04 ENCOUNTER — Other Ambulatory Visit: Payer: Self-pay | Admitting: Internal Medicine

## 2015-10-04 DIAGNOSIS — Z1151 Encounter for screening for human papillomavirus (HPV): Secondary | ICD-10-CM | POA: Insufficient documentation

## 2015-10-04 DIAGNOSIS — Z01419 Encounter for gynecological examination (general) (routine) without abnormal findings: Secondary | ICD-10-CM | POA: Insufficient documentation

## 2015-10-04 DIAGNOSIS — N950 Postmenopausal bleeding: Secondary | ICD-10-CM | POA: Diagnosis not present

## 2015-10-04 DIAGNOSIS — N898 Other specified noninflammatory disorders of vagina: Secondary | ICD-10-CM | POA: Diagnosis not present

## 2015-10-06 LAB — CYTOLOGY - PAP

## 2015-10-27 DIAGNOSIS — G8929 Other chronic pain: Secondary | ICD-10-CM | POA: Diagnosis not present

## 2015-10-27 DIAGNOSIS — M47816 Spondylosis without myelopathy or radiculopathy, lumbar region: Secondary | ICD-10-CM | POA: Diagnosis not present

## 2015-10-27 DIAGNOSIS — R0982 Postnasal drip: Secondary | ICD-10-CM | POA: Diagnosis not present

## 2015-10-27 DIAGNOSIS — M797 Fibromyalgia: Secondary | ICD-10-CM | POA: Diagnosis not present

## 2015-11-02 DIAGNOSIS — Z01411 Encounter for gynecological examination (general) (routine) with abnormal findings: Secondary | ICD-10-CM | POA: Diagnosis not present

## 2015-11-02 DIAGNOSIS — N950 Postmenopausal bleeding: Secondary | ICD-10-CM | POA: Diagnosis not present

## 2015-12-05 DIAGNOSIS — N950 Postmenopausal bleeding: Secondary | ICD-10-CM | POA: Diagnosis not present

## 2016-02-20 DIAGNOSIS — G459 Transient cerebral ischemic attack, unspecified: Secondary | ICD-10-CM

## 2016-02-20 HISTORY — DX: Transient cerebral ischemic attack, unspecified: G45.9

## 2016-02-22 DIAGNOSIS — G451 Carotid artery syndrome (hemispheric): Secondary | ICD-10-CM | POA: Diagnosis not present

## 2016-02-22 DIAGNOSIS — R011 Cardiac murmur, unspecified: Secondary | ICD-10-CM | POA: Diagnosis not present

## 2016-02-22 DIAGNOSIS — R0989 Other specified symptoms and signs involving the circulatory and respiratory systems: Secondary | ICD-10-CM | POA: Diagnosis not present

## 2016-02-22 DIAGNOSIS — Z23 Encounter for immunization: Secondary | ICD-10-CM | POA: Diagnosis not present

## 2016-02-23 ENCOUNTER — Other Ambulatory Visit: Payer: Self-pay | Admitting: Internal Medicine

## 2016-02-23 DIAGNOSIS — R011 Cardiac murmur, unspecified: Secondary | ICD-10-CM

## 2016-02-28 ENCOUNTER — Other Ambulatory Visit: Payer: Self-pay | Admitting: Internal Medicine

## 2016-02-28 DIAGNOSIS — R0989 Other specified symptoms and signs involving the circulatory and respiratory systems: Secondary | ICD-10-CM

## 2016-02-28 DIAGNOSIS — R4701 Aphasia: Secondary | ICD-10-CM

## 2016-02-29 ENCOUNTER — Other Ambulatory Visit: Payer: Self-pay

## 2016-02-29 ENCOUNTER — Ambulatory Visit (HOSPITAL_COMMUNITY): Payer: PPO | Attending: Cardiovascular Disease

## 2016-02-29 DIAGNOSIS — I501 Left ventricular failure: Secondary | ICD-10-CM | POA: Diagnosis not present

## 2016-02-29 DIAGNOSIS — R011 Cardiac murmur, unspecified: Secondary | ICD-10-CM

## 2016-02-29 DIAGNOSIS — I340 Nonrheumatic mitral (valve) insufficiency: Secondary | ICD-10-CM | POA: Insufficient documentation

## 2016-03-12 ENCOUNTER — Ambulatory Visit
Admission: RE | Admit: 2016-03-12 | Discharge: 2016-03-12 | Disposition: A | Discharge status: Home / Self Care | Payer: PPO | Source: Ambulatory Visit | Attending: Internal Medicine | Admitting: Internal Medicine

## 2016-03-12 DIAGNOSIS — R0989 Other specified symptoms and signs involving the circulatory and respiratory systems: Secondary | ICD-10-CM

## 2016-03-12 DIAGNOSIS — I6522 Occlusion and stenosis of left carotid artery: Secondary | ICD-10-CM | POA: Diagnosis not present

## 2016-03-12 DIAGNOSIS — R4701 Aphasia: Secondary | ICD-10-CM

## 2016-03-12 DIAGNOSIS — I6621 Occlusion and stenosis of right posterior cerebral artery: Secondary | ICD-10-CM | POA: Diagnosis not present

## 2016-03-14 ENCOUNTER — Encounter (INDEPENDENT_AMBULATORY_CARE_PROVIDER_SITE_OTHER): Payer: Self-pay

## 2016-03-14 ENCOUNTER — Encounter: Payer: Self-pay | Admitting: Gastroenterology

## 2016-03-14 ENCOUNTER — Ambulatory Visit (INDEPENDENT_AMBULATORY_CARE_PROVIDER_SITE_OTHER): Payer: PPO | Admitting: Gastroenterology

## 2016-03-14 VITALS — BP 118/70 | HR 64

## 2016-03-14 DIAGNOSIS — K5900 Constipation, unspecified: Secondary | ICD-10-CM | POA: Diagnosis not present

## 2016-03-14 DIAGNOSIS — R109 Unspecified abdominal pain: Secondary | ICD-10-CM

## 2016-03-14 DIAGNOSIS — Z711 Person with feared health complaint in whom no diagnosis is made: Secondary | ICD-10-CM

## 2016-03-14 NOTE — Patient Instructions (Addendum)
Miralax 1 dose once daily for constipation. Abdominal US for RUQ pains.  You have been scheduled for an abdominal ultrasound at [facility] (1st floor of hospital) on 03/20/16 at 830 am. Please arrive 15 minutes prior to your appointment for registration. Make certain not to have anything to eat or drink 6 hours prior to your appointment. Should you need to reschedule your appointment, please contact radiology at [phone]. This test typically takes about 30 minutes to perform.

## 2016-03-14 NOTE — Progress Notes (Signed)
Review of pertinent gastrointestinal problems: 1. Adenomatous polyps: 2002 path report from colonoscopy:  I do not have a copy of the colonoscopy report at the time of this visit however.1. COLON: POLYPOID COLONIC MUCOSA. NO ADENOMATOUS CHANGE ORMALIGNANCY IDENTIFIED.2. COLON, POLYP: TUBULOVILLOUS ADENOMA. NO HIGH GRADE DYSPLASIA OR MALIGNANCY IDENTIFIED.Colonoscopy report Medoff 05/2009: internal hemorrhoids, no polyps,  He recommended repeat colonoscopy in 5 years. Colonsocopy Dr. Ardis Hughs 08/2015: 2 smalls, one was TA; recall at 5 year interval. 2. 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  HPI: [name]    who was referred to me by Lavone Orn, MD  to evaluate  intermittent right upper quadrant pains .    Chief complaint is intermittent right upper quadrant pains  She underwent a brain MRI 2 days ago for evaluation of "episode of a dysphagia, TIA"' in early October.  For 5-6 years she has severe RUQ pains, this occurs about once per month, ususlaly in early AM or late at night. Deep pain.  Sits up, takes several deep breaths and after 10-15 minutes the pain will usually improve she does not have associated nausea or vomiting with the pain. She cannot relate to any particular body positions,  foods that she eats..    She also has mild constipation for several decades  Takes laxatives to have BMs, if no laxatives then no bm in 3-4 days.    Takes advil periodically, for low back pain, a few times per week.  Review of systems: Pertinent positive and negative review of systems were noted in the above HPI section. Complete review of systems was performed and was otherwise normal.   Past Medical History:  Diagnosis Date  . Allergy   . Anxiety   . Arthritis   . 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 Heart murmur   . Insomnia   . Skin lesion of face    precancerous  . Thyroid disease   . TIA (transient ischemic  attack) 02/20/2016  . Tubular adenoma     Past Surgical History:  Procedure Laterality Date  . COLONOSCOPY    . DILATION AND CURETTAGE OF UTERUS    . EYE SURGERY     both cataracts  . INCISION AND DRAINAGE ABSCESS Right 01/27/2013   Procedure: INCISION AND DRAINAGE RIGHT THUMB PULP;  Surgeon: Wynonia Sours, MD;  Location: [facility];  Service: Orthopedics;  Laterality: Right;  . VEIN LIGATION      Current Outpatient Prescriptions  Medication Sig Dispense Refill  . aspirin 81 MG tablet Take 81 mg by mouth daily.    Marland Kitchen BIOTIN PO Take 2,000 mcg by mouth daily.    Marland Kitchen CALCIUM PO Take 630 mg by mouth daily.    . Cholecalciferol (VITAMIN D-3 PO) Take 2,000 Int'l Units by mouth daily.    . Cyanocobalamin (VITAMIN B-12 PO) Take 1 capsule by mouth daily.    . cycloSPORINE (RESTASIS) 0.05 % ophthalmic emulsion 1 drop 2 (two) times daily.    Marland Kitchen DIPHENHYDRAMINE HCL PO Take 25 mg by mouth daily.    . DULoxetine (CYMBALTA) 30 MG capsule     . Glucosamine-Chondroit-Vit C-Mn (GLUCOSAMINE 1500 COMPLEX PO) Take 1,500 mg by mouth daily.    . Magnesium 500 MG CAPS Take 500 mg by mouth daily.    . Multiple Vitamins-Minerals (MULTIVITAMIN PO) Take 1 capsule by mouth daily.    Marland Kitchen omeprazole (PRILOSEC) 20 MG capsule  Take 20 mg by mouth daily.    . polyethylene glycol (MIRALAX) 0.34 gm/ml SOLN Take 1 g/kg by mouth every 12 (twelve) hours.    . Probiotic Product (PROBIOTIC DAILY PO) Take 1 capsule by mouth daily.    . Turmeric 500 MG CAPS Take 1 capsule by mouth daily.     No current facility-administered medications for this visit.     Allergies as of 03/14/2016  . (No Known Allergies)    Family History  Problem Relation Age of Onset  . Diabetes Mother   . Heart disease Mother   . Heart disease Father   . Lung cancer Brother     smoker  . Heart disease Brother   . Diabetes Brother   . Heart disease Brother   . Diabetes Sister     x 2  . Stroke Sister   . Heart disease Brother      x 3   . Heart disease Sister     x 4  . Heart disease Sister   . Heart disease Sister   . Heart disease Sister   . Colon cancer Neg Hx     Social History   Social History  . Marital status: Widowed    Spouse name: N/A  . Number of children: 1  . Years of education: N/A   Occupational History  . retired    Social History Main Topics  . Smoking status: Never Smoker  . Smokeless tobacco: Never Used  . Alcohol use No  . Drug use: No  . Sexual activity: Not on file   Other Topics Concern  . Not on file   Social History Narrative  . No narrative on file     Physical Exam: BP 118/70   Pulse 64  Constitutional: generally well-appearing Psychiatric: alert and oriented x3 Eyes: extraocular movements intact Mouth: oral pharynx moist, no lesions Neck: supple no lymphadenopathy Cardiovascular: heart regular rate and rhythm Lungs: clear to auscultation bilaterally Abdomen: soft, nontender, nondistended, no obvious ascites, no peritoneal signs, normal bowel sounds Extremities: no lower extremity edema bilaterally Skin: no lesions on visible extremities   Assessment and plan: 75 y.o. female with  Intermittent right upper quadrant pains, mild constipation  her right upper quadrant pains certainly could be gallbladder, gallstone related I'm going to begin the workup with a right upper quadrant ultrasound. If gallstones are noted that I will likely refer her to a general surgeon to consider laparoscopic cholecystectomy. She does take periodic NSAIDs she is on proton pump inhibitor once daily. If the ultrasound is normal then I would favor upper endoscopy to complete any of the workup. She also mentioned some mild constipation. Fiber supplements have not helped her in the past and so I recommended she start taking MiraLAX on a daily basis instead of just every 3 or 4 days.    Owens Loffler, MD [facility] 03/14/2016, 10:22 AM  Cc: Lavone Orn, MD

## 2016-03-15 DIAGNOSIS — R030 Elevated blood-pressure reading, without diagnosis of hypertension: Secondary | ICD-10-CM | POA: Diagnosis not present

## 2016-03-15 DIAGNOSIS — G459 Transient cerebral ischemic attack, unspecified: Secondary | ICD-10-CM | POA: Diagnosis not present

## 2016-03-20 ENCOUNTER — Ambulatory Visit (HOSPITAL_COMMUNITY)
Admission: RE | Admit: 2016-03-20 | Discharge: 2016-03-20 | Disposition: A | Discharge status: Home / Self Care | Payer: PPO | Source: Ambulatory Visit | Attending: Gastroenterology | Admitting: Gastroenterology

## 2016-03-20 DIAGNOSIS — R109 Unspecified abdominal pain: Secondary | ICD-10-CM | POA: Insufficient documentation

## 2016-03-20 DIAGNOSIS — Z711 Person with feared health complaint in whom no diagnosis is made: Secondary | ICD-10-CM

## 2016-03-20 DIAGNOSIS — R1011 Right upper quadrant pain: Secondary | ICD-10-CM | POA: Diagnosis not present

## 2016-03-29 DIAGNOSIS — C44719 Basal cell carcinoma of skin of left lower limb, including hip: Secondary | ICD-10-CM | POA: Diagnosis not present

## 2016-03-29 DIAGNOSIS — D485 Neoplasm of uncertain behavior of skin: Secondary | ICD-10-CM | POA: Diagnosis not present

## 2016-03-29 DIAGNOSIS — Z85828 Personal history of other malignant neoplasm of skin: Secondary | ICD-10-CM | POA: Diagnosis not present

## 2016-06-27 DIAGNOSIS — Z1389 Encounter for screening for other disorder: Secondary | ICD-10-CM | POA: Diagnosis not present

## 2016-06-27 DIAGNOSIS — M85851 Other specified disorders of bone density and structure, right thigh: Secondary | ICD-10-CM | POA: Diagnosis not present

## 2016-06-27 DIAGNOSIS — Z8673 Personal history of transient ischemic attack (TIA), and cerebral infarction without residual deficits: Secondary | ICD-10-CM | POA: Diagnosis not present

## 2016-06-27 DIAGNOSIS — M797 Fibromyalgia: Secondary | ICD-10-CM | POA: Diagnosis not present

## 2016-06-27 DIAGNOSIS — M47816 Spondylosis without myelopathy or radiculopathy, lumbar region: Secondary | ICD-10-CM | POA: Diagnosis not present

## 2016-06-27 DIAGNOSIS — E7800 Pure hypercholesterolemia, unspecified: Secondary | ICD-10-CM | POA: Diagnosis not present

## 2016-06-27 DIAGNOSIS — I10 Essential (primary) hypertension: Secondary | ICD-10-CM | POA: Diagnosis not present

## 2016-06-27 DIAGNOSIS — K5900 Constipation, unspecified: Secondary | ICD-10-CM | POA: Diagnosis not present

## 2016-06-27 DIAGNOSIS — M1288 Other specific arthropathies, not elsewhere classified, other specified site: Secondary | ICD-10-CM | POA: Diagnosis not present

## 2016-06-27 DIAGNOSIS — Z0000 Encounter for general adult medical examination without abnormal findings: Secondary | ICD-10-CM | POA: Diagnosis not present

## 2016-06-28 DIAGNOSIS — I10 Essential (primary) hypertension: Secondary | ICD-10-CM | POA: Diagnosis not present

## 2016-07-11 DIAGNOSIS — Z961 Presence of intraocular lens: Secondary | ICD-10-CM | POA: Diagnosis not present

## 2016-07-11 DIAGNOSIS — H43811 Vitreous degeneration, right eye: Secondary | ICD-10-CM | POA: Diagnosis not present

## 2016-07-11 DIAGNOSIS — H16223 Keratoconjunctivitis sicca, not specified as Sjogren's, bilateral: Secondary | ICD-10-CM | POA: Diagnosis not present

## 2016-07-11 DIAGNOSIS — H0011 Chalazion right upper eyelid: Secondary | ICD-10-CM | POA: Diagnosis not present

## 2016-07-11 DIAGNOSIS — H26493 Other secondary cataract, bilateral: Secondary | ICD-10-CM | POA: Diagnosis not present

## 2016-08-08 DIAGNOSIS — M8588 Other specified disorders of bone density and structure, other site: Secondary | ICD-10-CM | POA: Diagnosis not present

## 2016-08-20 DIAGNOSIS — I10 Essential (primary) hypertension: Secondary | ICD-10-CM | POA: Diagnosis not present

## 2016-08-20 DIAGNOSIS — M8588 Other specified disorders of bone density and structure, other site: Secondary | ICD-10-CM | POA: Diagnosis not present

## 2016-09-27 DIAGNOSIS — D485 Neoplasm of uncertain behavior of skin: Secondary | ICD-10-CM | POA: Diagnosis not present

## 2016-09-27 DIAGNOSIS — Z85828 Personal history of other malignant neoplasm of skin: Secondary | ICD-10-CM | POA: Diagnosis not present

## 2016-09-27 DIAGNOSIS — L570 Actinic keratosis: Secondary | ICD-10-CM | POA: Diagnosis not present

## 2016-09-27 DIAGNOSIS — L820 Inflamed seborrheic keratosis: Secondary | ICD-10-CM | POA: Diagnosis not present

## 2016-09-27 DIAGNOSIS — L738 Other specified follicular disorders: Secondary | ICD-10-CM | POA: Diagnosis not present

## 2016-12-07 DIAGNOSIS — J019 Acute sinusitis, unspecified: Secondary | ICD-10-CM | POA: Diagnosis not present

## 2016-12-07 DIAGNOSIS — R220 Localized swelling, mass and lump, head: Secondary | ICD-10-CM | POA: Diagnosis not present

## 2017-02-12 ENCOUNTER — Encounter: Payer: Self-pay | Admitting: Podiatry

## 2017-02-12 ENCOUNTER — Ambulatory Visit (INDEPENDENT_AMBULATORY_CARE_PROVIDER_SITE_OTHER): Payer: PPO | Admitting: Podiatry

## 2017-02-12 VITALS — BP 119/75 | HR 80 | Resp 16

## 2017-02-12 DIAGNOSIS — L600 Ingrowing nail: Secondary | ICD-10-CM

## 2017-02-12 DIAGNOSIS — B351 Tinea unguium: Secondary | ICD-10-CM | POA: Diagnosis not present

## 2017-02-12 DIAGNOSIS — L603 Nail dystrophy: Secondary | ICD-10-CM | POA: Diagnosis not present

## 2017-02-12 MED ORDER — NEOMYCIN-POLYMYXIN-HC 3.5-10000-1 OT SOLN: OTIC | 0 refills | Status: DC

## 2017-02-12 NOTE — Progress Notes (Signed)
Subjective:  Patient ID: Megan Lester, female    DOB: 1940/08/17,  MRN: [id number] HPI Chief Complaint  Patient presents with  . Toe Pain    Hallux left - both borders, only hurts with certain shoes/pressure, active in dance-notices feet tingle sometimes    76 y.o. female presents with the above complaint.   She presents today with chief complaint of pain to the hallux left. She states it only bothers me with certain shoes but this nail feels like it is ingrowing. She doesn't look like it's infected but she states that she is on her feet dances a lot and sometimes it is mildly tender. She is also concerned that the left foot does appear to be demonstrating some fungal changes to the nails.  Past Medical History:  Diagnosis Date  . Allergy   . Anxiety   . Arthritis   . 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 Heart murmur   . Insomnia   . Skin lesion of face    precancerous  . Thyroid disease   . TIA (transient ischemic attack) 02/20/2016  . Tubular adenoma    Past Surgical History:  Procedure Laterality Date  . COLONOSCOPY    . DILATION AND CURETTAGE OF UTERUS    . EYE SURGERY     both cataracts  . INCISION AND DRAINAGE ABSCESS Right 01/27/2013   Procedure: INCISION AND DRAINAGE RIGHT THUMB PULP;  Surgeon: Wynonia Sours, MD;  Location: [facility];  Service: Orthopedics;  Laterality: Right;  . VEIN LIGATION      Current Outpatient Prescriptions:  .  aspirin 81 MG tablet, Take 81 mg by mouth daily., Disp: , Rfl:  .  BIOTIN PO, Take 2,000 mcg by mouth daily., Disp: , Rfl:  .  CALCIUM PO, Take 630 mg by mouth daily., Disp: , Rfl:  .  Cholecalciferol (VITAMIN D-3 PO), Take 2,000 Int'l Units by mouth daily., Disp: , Rfl:  .  Cyanocobalamin (VITAMIN B-12 PO), Take 1 capsule by mouth daily., Disp: , Rfl:  .  cycloSPORINE (RESTASIS) 0.05 % ophthalmic emulsion, 1 drop 2 (two) times daily., Disp: , Rfl:  .  DIPHENHYDRAMINE HCL  PO, Take 25 mg by mouth daily., Disp: , Rfl:  .  DULoxetine (CYMBALTA) 30 MG capsule, , Disp: , Rfl:  .  Glucosamine-Chondroit-Vit C-Mn (GLUCOSAMINE 1500 COMPLEX PO), Take 1,500 mg by mouth daily., Disp: , Rfl:  .  Magnesium 500 MG CAPS, Take 500 mg by mouth daily., Disp: , Rfl:  .  Multiple Vitamins-Minerals (MULTIVITAMIN PO), Take 1 capsule by mouth daily., Disp: , Rfl:  .  omeprazole (PRILOSEC) 20 MG capsule, Take 20 mg by mouth daily., Disp: , Rfl:  .  polyethylene glycol (MIRALAX) 0.34 gm/ml SOLN, Take 1 g/kg by mouth every 12 (twelve) hours., Disp: , Rfl:  .  Probiotic Product (PROBIOTIC DAILY PO), Take 1 capsule by mouth daily., Disp: , Rfl:  .  Turmeric 500 MG CAPS, Take 1 capsule by mouth daily., Disp: , Rfl:   No Known Allergies Review of Systems  HENT: Positive for sinus pressure and tinnitus.   Musculoskeletal: Positive for arthralgias and back pain.  Neurological: Positive for numbness.  All other systems reviewed and are negative.  Objective:   Vitals:   02/12/17 1418  BP: 119/75  Pulse: 80  Resp: 16    General: Well developed, nourished, in no acute distress, alert and oriented x3   Dermatological:  Skin is warm, dry and supple bilateral. Nails x 10 are well maintained; remaining integument appears unremarkable at this time. There are no open sores, no preulcerative lesions, no rash or signs of infection present.Hallux nail left does demonstrate sharp serrated nail margins with mild tenderness on palpation mild erythema to the tibia and fibular border. There does appear to be subungual debris and thickening of the toenails consistent with possible mycosis.  Vascular: Dorsalis Pedis artery and Posterior Tibial artery pedal pulses are 2/4 bilateral with immedate capillary fill time. Pedal hair growth present. No varicosities and no lower extremity edema present bilateral.   Neruologic: Grossly intact via light touch bilateral. Vibratory intact via tuning fork bilateral.  Protective threshold with Semmes Wienstein monofilament intact to all pedal sites bilateral. Patellar and Achilles deep tendon reflexes 2+ bilateral. No Babinski or clonus noted bilateral.   Musculoskeletal: No gross boney pedal deformities bilateral. No pain, crepitus, or limitation noted with foot and ankle range of motion bilateral. Muscular strength 5/5 in all groups tested bilateral.  Gait: Unassisted, Nonantalgic.    Radiographs:  None taken  Assessment & Plan:   Assessment: Ingrown toenail hallux left. Nail dystrophy 1 through 5 left foot.  Plan: We discussed etiology pathology conservative versus surgical therapies. At this point I removed the tibial and fibular border of the hallux nail left. This is performed after local anesthesia was administered. She tolerated procedure well and was provided with both oral and written home-going instructions for care and soaking of her toe as well as Cortisporin otic be applied twice daily after soaking. We also took samples of the remaining nails to be sent for pathologic evaluation and expect this to demonstrate onychomycosis.     Pecolia Marando T. [place], [place]

## 2017-02-12 NOTE — Patient Instructions (Signed)

## 2017-02-13 ENCOUNTER — Other Ambulatory Visit: Payer: Self-pay

## 2017-02-13 MED ORDER — NEOMYCIN-POLYMYXIN-HC 3.5-10000-1 OT SOLN: OTIC | 0 refills | Status: DC

## 2017-02-14 ENCOUNTER — Telehealth: Payer: Self-pay | Admitting: Podiatry

## 2017-02-14 ENCOUNTER — Telehealth: Payer: Self-pay

## 2017-02-14 NOTE — Telephone Encounter (Signed)
Spoke with patient regarding post nail avulsion care, soaking and bandaging and indicated time frames for each, advised on s/s of infection

## 2017-02-14 NOTE — Telephone Encounter (Signed)
I was seen on Tuesday and had an ingrown nail procedure done. I was given the betadine soaking instructions but I don't know how long I am supposed to do the soaks for. Also, it is turning my foot and toe/toenails orange. You can reach me at [phone]. Thank you.

## 2017-02-14 NOTE — Telephone Encounter (Signed)
I missed Jessica's call. If you would have her call me back at [phone] I will be here for the rest of the day. Thank you.

## 2017-02-14 NOTE — Telephone Encounter (Signed)
LVM for patient to return call. 

## 2017-02-26 ENCOUNTER — Ambulatory Visit: Payer: PPO | Admitting: Podiatry

## 2017-02-28 ENCOUNTER — Ambulatory Visit (INDEPENDENT_AMBULATORY_CARE_PROVIDER_SITE_OTHER): Payer: PPO | Admitting: Podiatry

## 2017-02-28 ENCOUNTER — Encounter: Payer: Self-pay | Admitting: Podiatry

## 2017-02-28 DIAGNOSIS — L600 Ingrowing nail: Secondary | ICD-10-CM

## 2017-02-28 NOTE — Patient Instructions (Signed)

## 2017-02-28 NOTE — Progress Notes (Signed)
She presents today for follow-up of her nail procedure hallux left. She states that she stop soaking about 3 days ago. States that she has some mild tenderness at the site of the matrixectomy.  Objective: Vital signs are stable alert and oriented 3 there is no erythema edema cellulitis drainage or odor. Pulses are palpable. She has mild erythematous to the proximal nail folds for the matrixectomy was performed but all in all it looks very good.  Assessment: Well-healing surgical toe hallux right.  Plan: Continue to soak in warm water at least daily until the redness has subsided cover with a Band-Aid during the day. Open at bedtime.

## 2017-03-05 DIAGNOSIS — H16223 Keratoconjunctivitis sicca, not specified as Sjogren's, bilateral: Secondary | ICD-10-CM | POA: Diagnosis not present

## 2017-03-05 DIAGNOSIS — Z961 Presence of intraocular lens: Secondary | ICD-10-CM | POA: Diagnosis not present

## 2017-03-05 DIAGNOSIS — H00025 Hordeolum internum left lower eyelid: Secondary | ICD-10-CM | POA: Diagnosis not present

## 2017-04-04 DIAGNOSIS — L812 Freckles: Secondary | ICD-10-CM | POA: Diagnosis not present

## 2017-04-04 DIAGNOSIS — C44519 Basal cell carcinoma of skin of other part of trunk: Secondary | ICD-10-CM | POA: Diagnosis not present

## 2017-04-04 DIAGNOSIS — Z85828 Personal history of other malignant neoplasm of skin: Secondary | ICD-10-CM | POA: Diagnosis not present

## 2017-04-04 DIAGNOSIS — L438 Other lichen planus: Secondary | ICD-10-CM | POA: Diagnosis not present

## 2017-04-04 DIAGNOSIS — L821 Other seborrheic keratosis: Secondary | ICD-10-CM | POA: Diagnosis not present

## 2017-04-04 DIAGNOSIS — C44319 Basal cell carcinoma of skin of other parts of face: Secondary | ICD-10-CM | POA: Diagnosis not present

## 2017-04-04 DIAGNOSIS — D485 Neoplasm of uncertain behavior of skin: Secondary | ICD-10-CM | POA: Diagnosis not present

## 2017-04-04 DIAGNOSIS — L570 Actinic keratosis: Secondary | ICD-10-CM | POA: Diagnosis not present

## 2017-04-08 DIAGNOSIS — R635 Abnormal weight gain: Secondary | ICD-10-CM | POA: Diagnosis not present

## 2017-04-08 DIAGNOSIS — Z23 Encounter for immunization: Secondary | ICD-10-CM | POA: Diagnosis not present

## 2017-04-08 DIAGNOSIS — I10 Essential (primary) hypertension: Secondary | ICD-10-CM | POA: Diagnosis not present

## 2017-04-08 DIAGNOSIS — G4700 Insomnia, unspecified: Secondary | ICD-10-CM | POA: Diagnosis not present

## 2017-05-06 DIAGNOSIS — C44319 Basal cell carcinoma of skin of other parts of face: Secondary | ICD-10-CM | POA: Diagnosis not present

## 2017-05-06 DIAGNOSIS — Z85828 Personal history of other malignant neoplasm of skin: Secondary | ICD-10-CM | POA: Diagnosis not present

## 2017-05-28 DIAGNOSIS — H00021 Hordeolum internum right upper eyelid: Secondary | ICD-10-CM | POA: Diagnosis not present

## 2017-06-11 DIAGNOSIS — M545 Low back pain: Secondary | ICD-10-CM | POA: Diagnosis not present

## 2017-06-11 DIAGNOSIS — G5701 Lesion of sciatic nerve, right lower limb: Secondary | ICD-10-CM | POA: Diagnosis not present

## 2017-06-19 IMAGING — MR MR HEAD W/O CM
8 of 11 series · 28 of 48 positions shown · non-contrast
Comparison: None available.

CLINICAL DATA: Initial evaluation for episode of aphasia/ TIA on
02/20/2016. Left carotid bruit. Per the

EXAM:
MRI HEAD WITHOUT CONTRAST
MRA HEAD WITHOUT CONTRAST
TECHNIQUE: Multiplanar, multiecho pulse sequences of the brain and surrounding
structures were obtained without intravenous contrast. Angiographic
images of the head were obtained using MRA technique without
contrast.

[Series 3: T1 · sagittal · 5.0mm · 0.45mm/px · 1 of 21 slices shown]
[im 1/21]
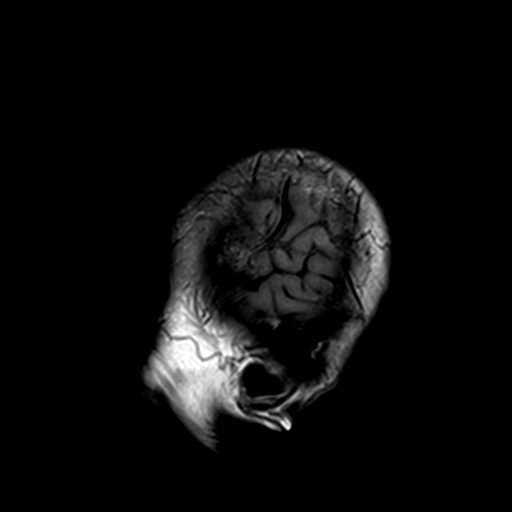

[Series 4: tof_3d_multi-slab · axial · 0.7mm · 0.35mm/px · z∈[-41,+64]mm · 8 of 152 slices shown]
[im 1/152]
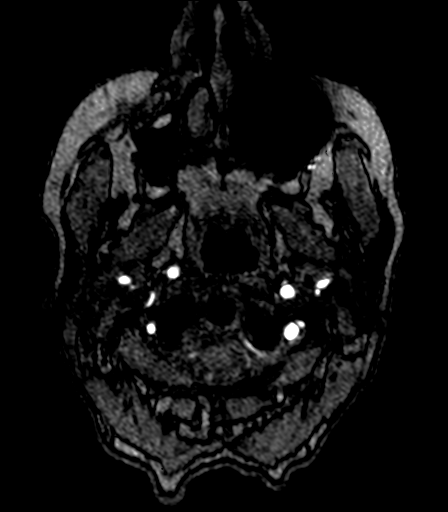
[im 17/152]
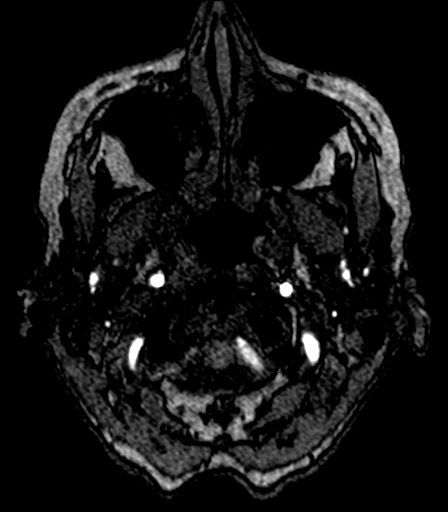
[im 51/152]
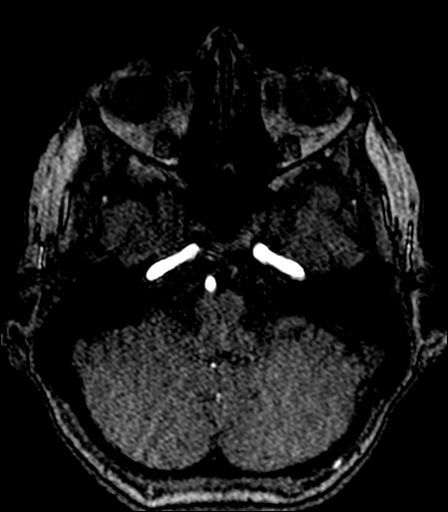
[im 68/152]
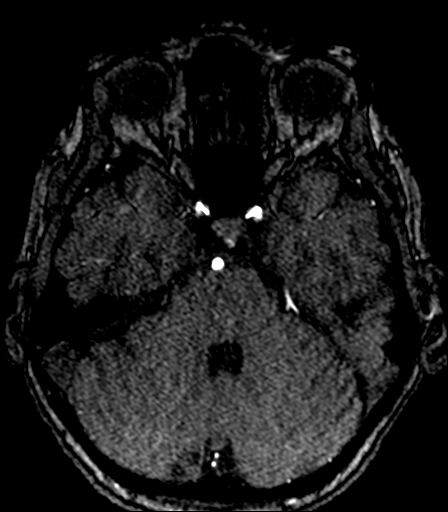
[im 84/152]
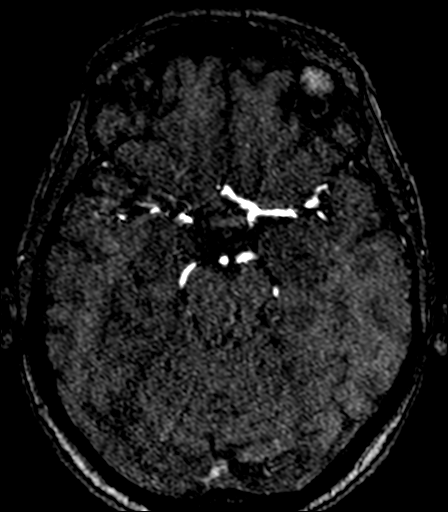
[im 101/152]
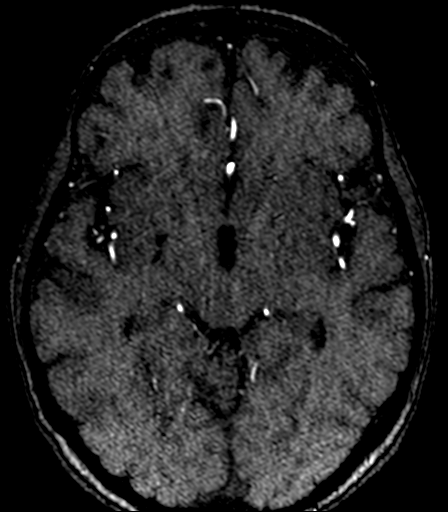
[im 135/152]
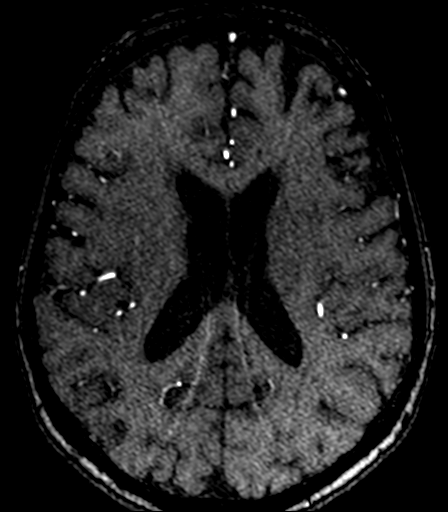
[im 152/152]
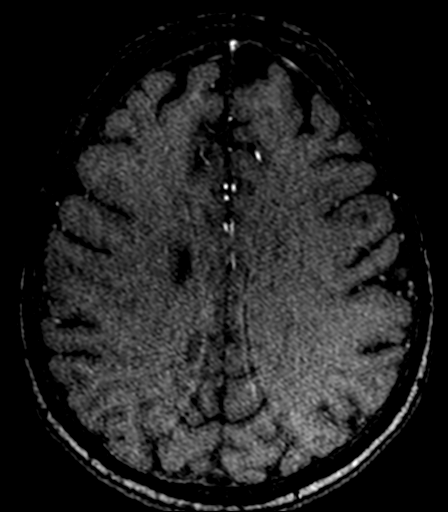

[Series 8: DWI · axial · 3.0mm · 0.94mm/px · z∈[-41,+100]mm · 7 of 95 slices shown (1 of 2)]
[im 1/95]
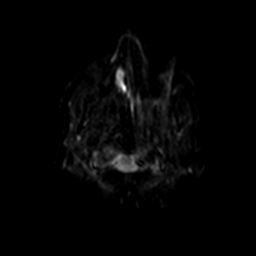
[im 16/95]
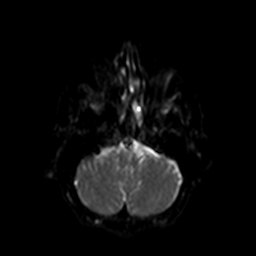
[im 32/95]
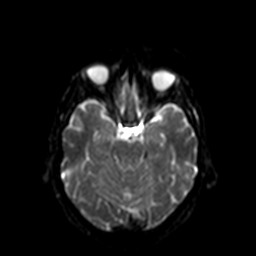
[im 48/95]
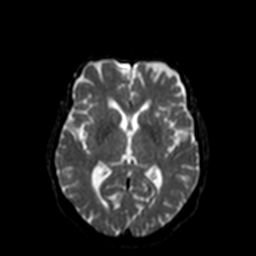
[im 63/95]
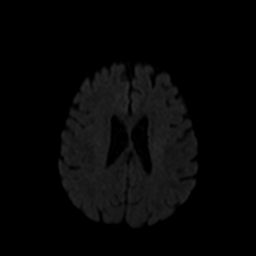
[im 79/95]
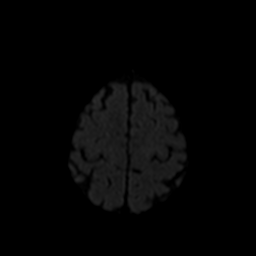
[im 95/95]
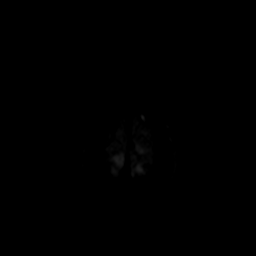

[Series 9: dwi_adc · axial · 3.0mm · 0.94mm/px · 1 of 48 slices shown]
[im 1/48]
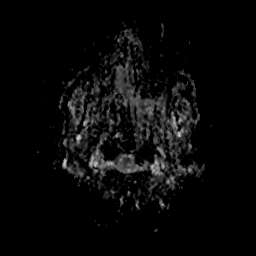

[Series 10: DWI · coronal · 5.0mm · 0.90mm/px · 5 of 68 slices shown (2 of 2)]
[im 1/68]
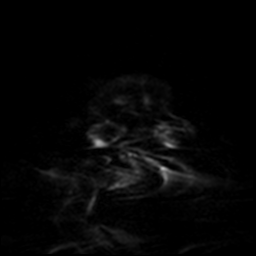
[im 17/68]
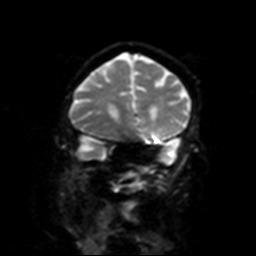
[im 34/68]
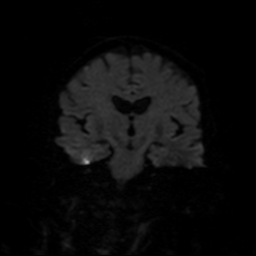
[im 51/68]
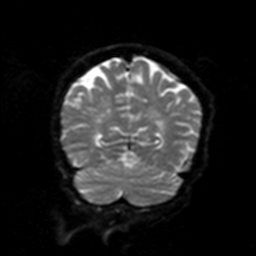
[im 68/68]
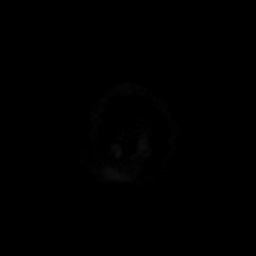

[Series 12: T2 · axial · 5.0mm · 0.51mm/px · z∈[-42,+100]mm · 2 of 22 slices shown (1 of 2)]
[im 1/22]
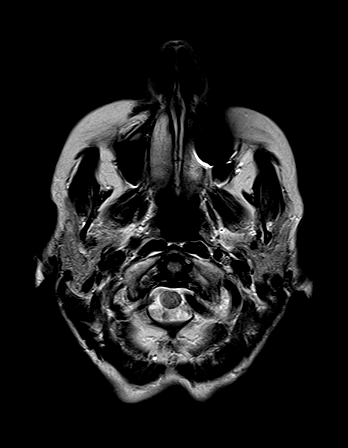
[im 22/22]
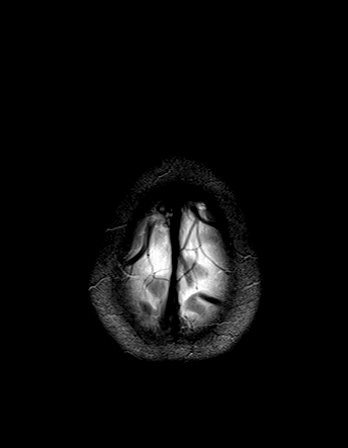

[Series 13: FLAIR · axial · 5.0mm · 0.45mm/px · z∈[-39,+97]mm · 2 of 22 slices shown]
[im 1/22]
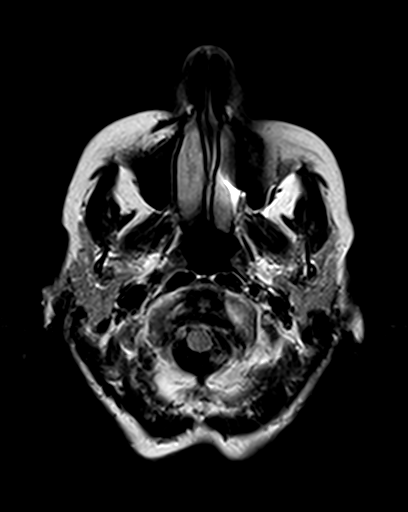
[im 22/22]
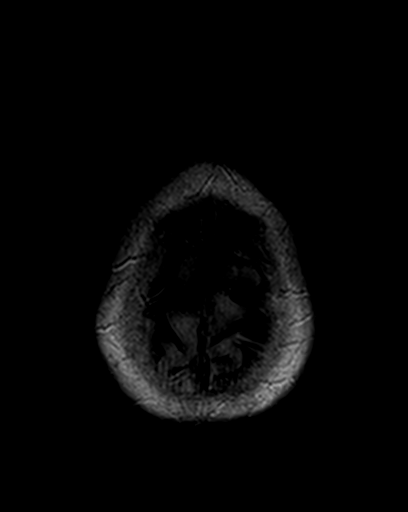

[Series 17: T2 · coronal · 5.0mm · 0.45mm/px · 2 of 25 slices shown (2 of 2)]
[im 1/25]
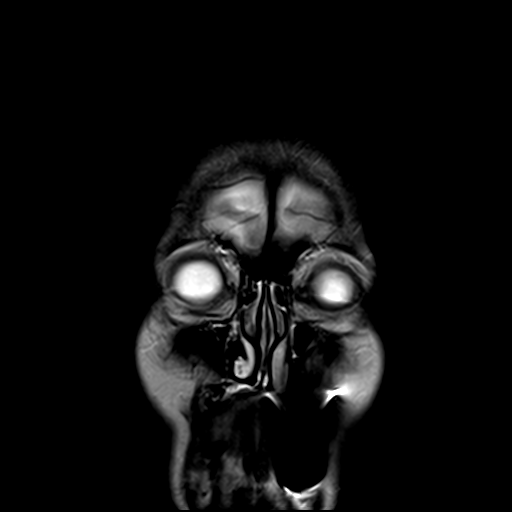
[im 25/25]
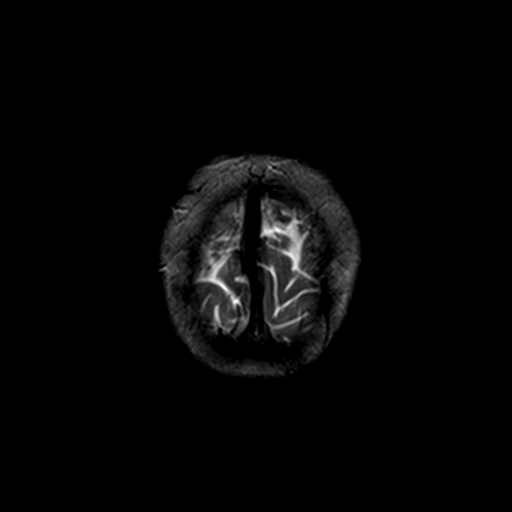

[28 of 48 positions shown; findings below may reference images not displayed]

FINDINGS: MRI HEAD FINDINGS

Brain: Cerebral volume within normal limits for patient age. Patchy
T2/FLAIR hyperintensity within the periventricular and deep white
matter both cerebral hemispheres present, nonspecific, but most like
related to chronic microvascular ischemic changes, moderate in
nature.

No abnormal foci of restricted diffusion to suggest acute or
subacute ischemia. Gray-white matter differentiation maintained. No
evidence for acute intracranial hemorrhage. No areas of chronic
infarction identified. Single subcentimeter focus of susceptibility
artifact present within knee posterior right centrum semi ovale
(series 16, image 62), likely a small chronic microhemorrhage. This
is most likely related to underlying chronic hypertension. No other
areas of chronic hemorrhage identified.

No mass lesion, midline shift, or mass effect. No hydrocephalus. No
extra-axial fluid collection. Major dural sinuses are grossly
patent.

Pituitary gland normal.  Suprasellar region within normal limits.

Vascular: Major intracranial vascular flow voids are well preserved.

Skull and upper cervical spine: Craniocervical junction normal.
Visualized upper cervical spine unremarkable. Bone marrow signal
intensity within normal limits. No scalp soft tissue abnormality.

Sinuses/Orbits: Globes and orbital soft tissues within normal
limits. Patient is status post lens extraction bilaterally.
Paranasal sinuses are clear. No mastoid effusion. Inner ear
structures grossly normal.

MRA HEAD FINDINGS

ANTERIOR CIRCULATION:

Distal cervical segments of the internal carotid arteries are patent
with antegrade flow. Petrous segments patent bilaterally. Multifocal
atheromatous irregularity with mild multi focal narrowing present
within the cavernous/supraclinoid ICAs bilaterally. Right ICA
slightly diminutive as compared to the left. Left A1 segment patent.
Right A1 segment hypoplastic and/or absent, which likely accounts
for the slightly diminutive right ICA. Anterior cerebral arteries
demonstrate mild multi focal atheromatous irregularity but are
patent to their distal aspects. Right ACA is attenuated as compared
to the left. The

Left M1 segment patent without stenosis or occlusion. Right M1
segment demonstrates multifocal atheromatous irregularity without
high-grade flow-limiting stenosis. MCA bifurcations within normal
limits. Distal MCA branches well opacified and symmetric. Mild
distal small vessel atheromatous irregularity.

POSTERIOR CIRCULATION:

Left vertebral artery dominant and widely patent to the
vertebrobasilar junction. Right vertebral artery diminutive but
patent as well. Posterior inferior cerebral arteries are patent
proximally. Basilar artery widely patent to its distal aspect.
Superior cerebellar arteries patent bilaterally. Both of the
posterior cerebral artery is largely supplied via the basilar
artery, although a a prominent left posterior communicating artery
is noted. Short-segment severe right P2 stenosis noted (series 21,
image 10). Additional mild atheromatous irregularity within the PCAs
bilaterally, which are supplied to their distal aspects.

No aneurysm or vascular malformation.
IMPRESSION: MRI HEAD IMPRESSION:

1. No acute intracranial process identified. Specifically no
evidence for acute or subacute ischemia to correspond with patient's
recent symptoms.
2. Moderate cerebral white matter disease, nonspecific, but most
likely related to chronic microvascular ischemic changes.
3. Single sub cm focus of susceptibility artifact within the
posterior right centrum semi ovale as above, likely a small chronic
microhemorrhage. Finding most likely hypertensive in nature. No
evidence for acute intracranial hemorrhage.

MRA HEAD IMPRESSION:

1. Negative MRA for large or proximal arterial branch occlusion.
2. Short-segment severe right P2 stenosis. No other focal high-grade
stenosis identified within the intracranial circulation. No
correctable stenosis identified.
3. Multifocal atheromatous irregularity involving the anterior and
posterior circulation as above.

## 2017-06-19 IMAGING — US US CAROTID DUPLEX BILAT
1 series · 13 of 24 positions shown · non-contrast
Comparison: 03/23/2004

CLINICAL DATA: Left carotid bruit.  Brief episode of aphasia.

EXAM:
BILATERAL CAROTID DUPLEX ULTRASOUND
TECHNIQUE: Gray scale imaging, color Doppler and duplex ultrasound were
performed of bilateral carotid and vertebral arteries in the neck.

[Series 1: us carotid duplex bilat · 0.06mm/px · 13 of 52 slices shown]
[im 1/52]
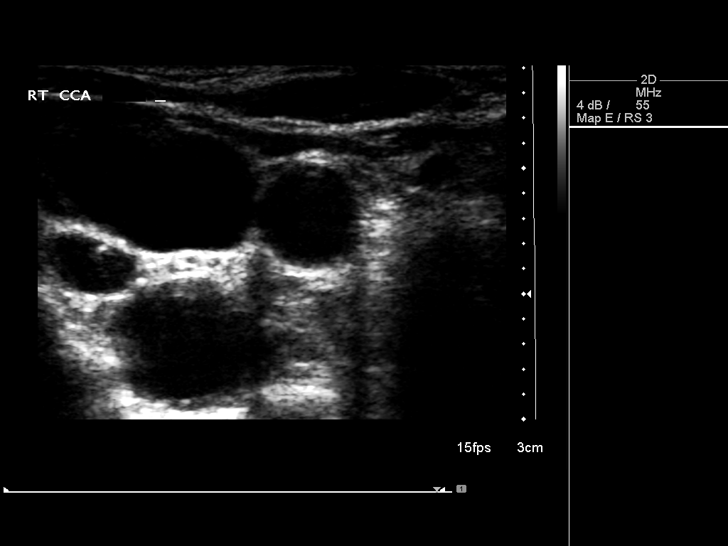
[im 5/52]
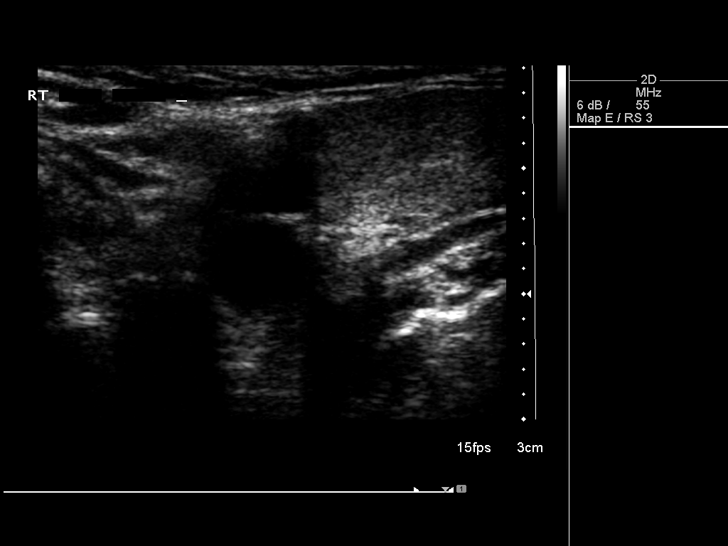
[im 9/52]
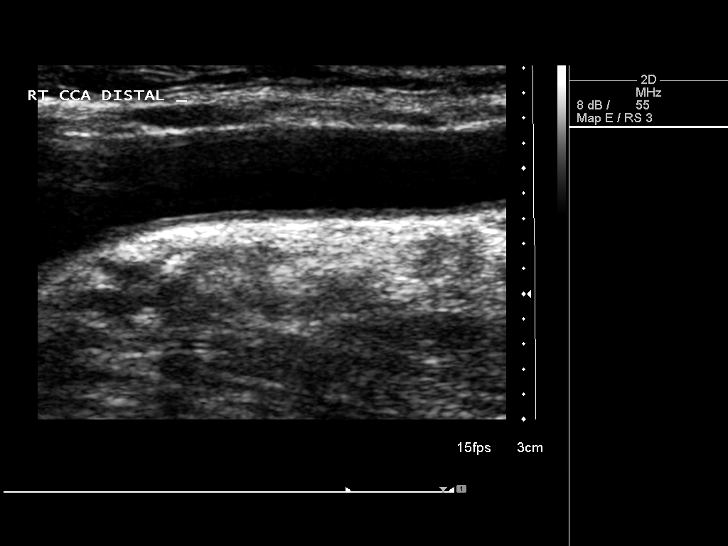
[im 14/52]
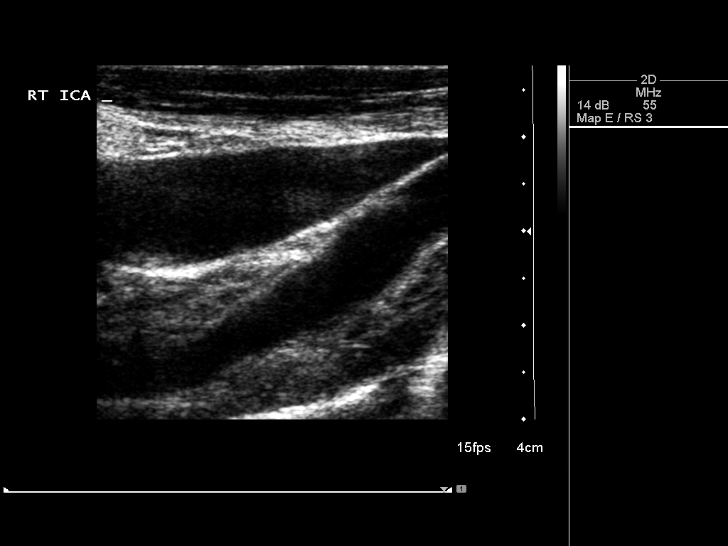
[im 18/52]
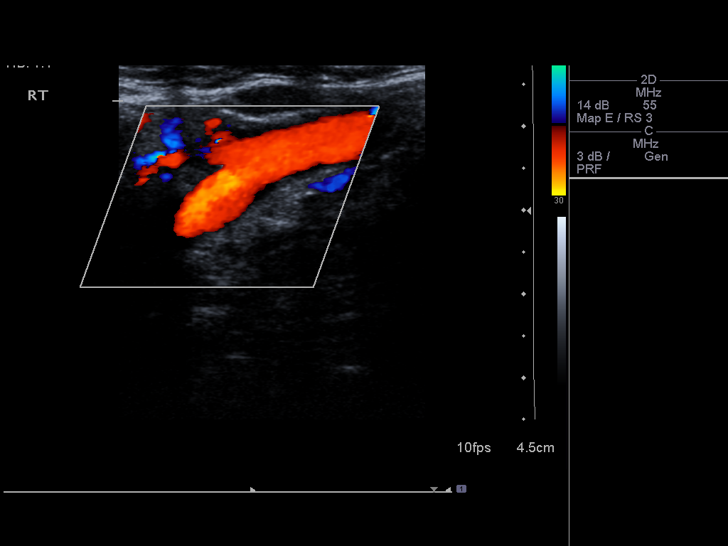
[im 23/52]
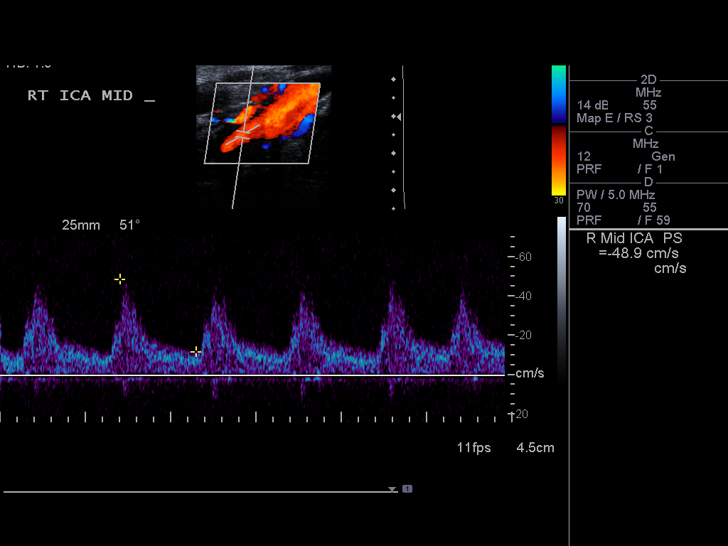
[im 27/52]
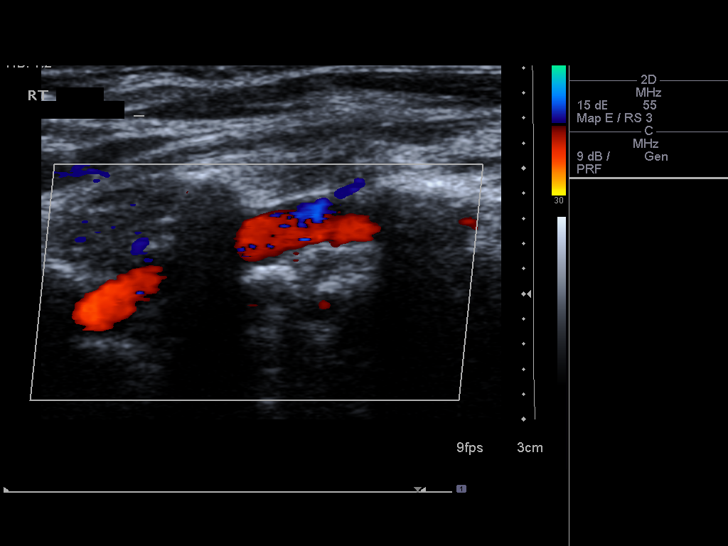
[im 29/52]
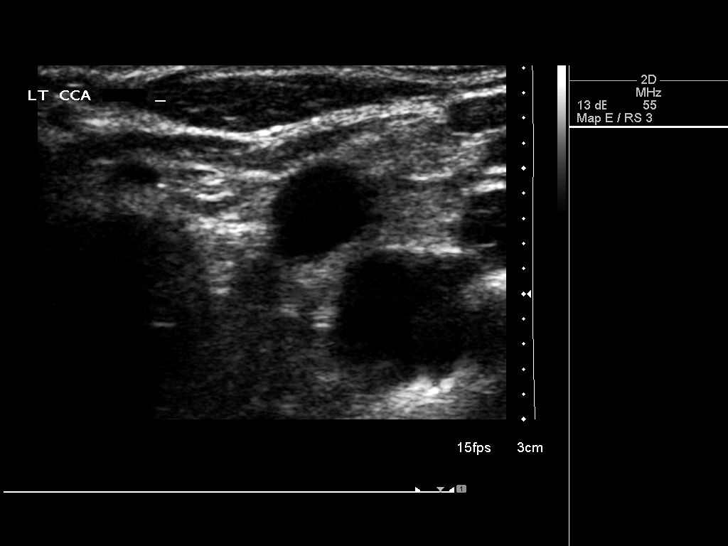
[im 34/52]
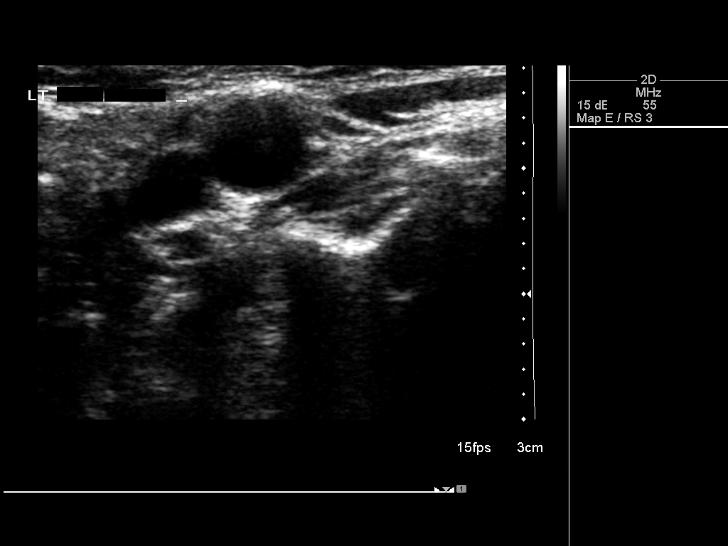
[im 38/52]
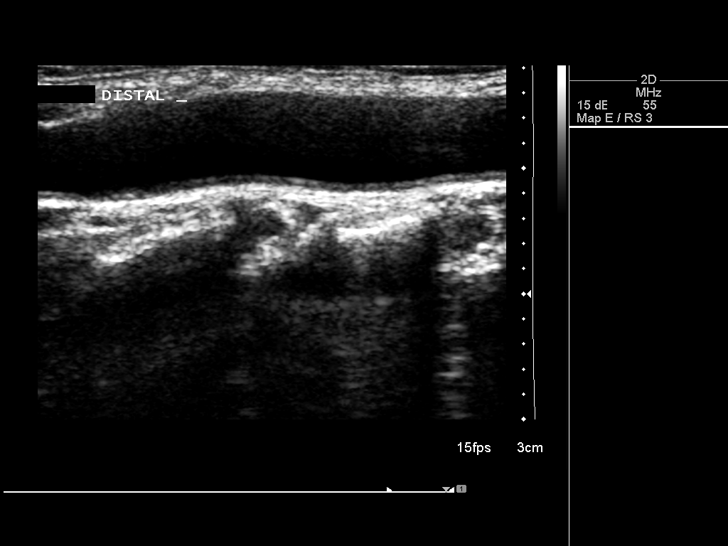
[im 43/52]
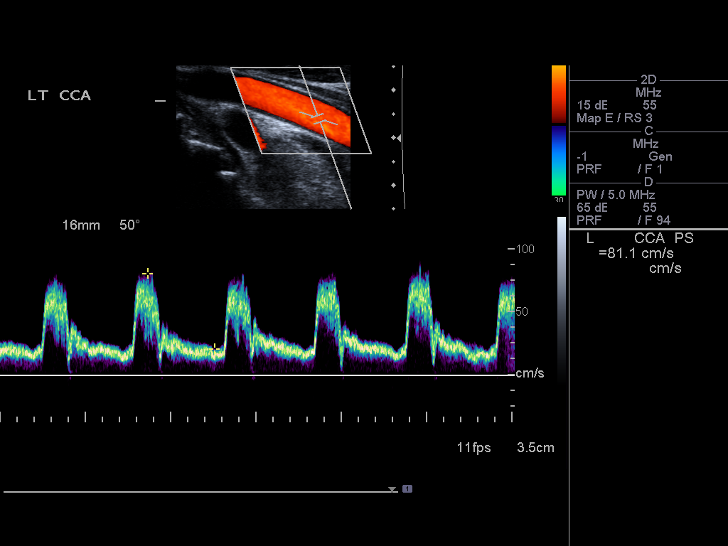
[im 47/52]
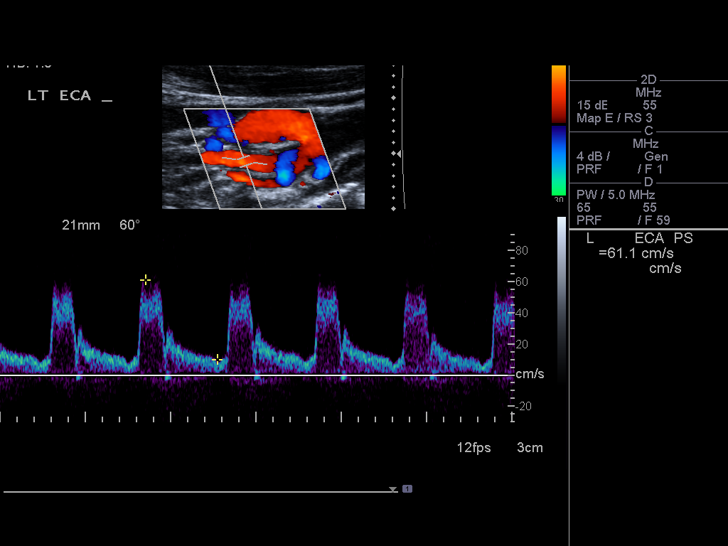
[im 52/52]
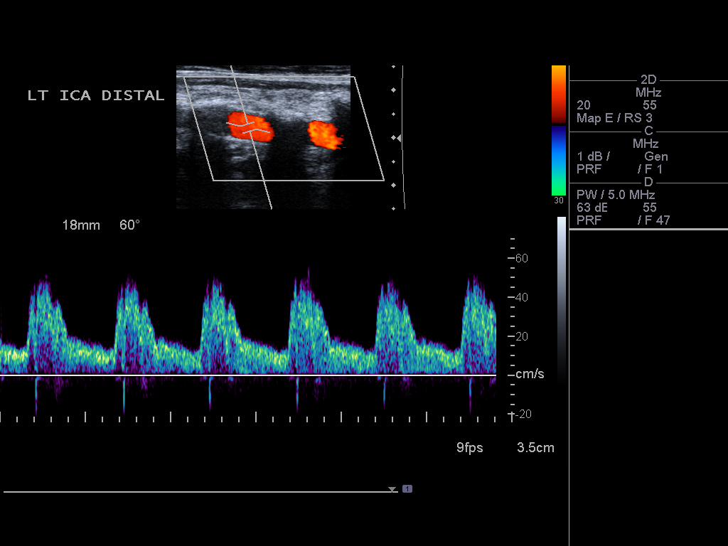

[13 of 24 positions shown; findings below may reference images not displayed]

FINDINGS: Criteria: Quantification of carotid stenosis is based on velocity
parameters that correlate the residual internal carotid diameter
with NASCET-based stenosis levels, using the diameter of the distal
internal carotid lumen as the denominator for stenosis measurement.

The following velocity measurements were obtained:

RIGHT

ICA:  58 cm/sec

CCA:  77 cm/sec

SYSTOLIC ICA/CCA RATIO:

DIASTOLIC ICA/CCA RATIO:

ECA:  55 cm/sec

LEFT

ICA:  78 cm/sec

CCA:  81 cm/sec

SYSTOLIC ICA/CCA RATIO:

DIASTOLIC ICA/CCA RATIO:

ECA:  61 cm/sec

RIGHT CAROTID ARTERY: Right carotid arteries are patent without
significant plaque or stenosis. Normal waveforms and velocities in
the right internal carotid artery.

RIGHT VERTEBRAL ARTERY: Antegrade flow and normal waveform in the
right vertebral artery.

LEFT CAROTID ARTERY: Small amount of plaque at the left carotid bulb
and origin of the left external carotid artery. Left internal
carotid artery is patent with normal waveforms and velocities.

LEFT VERTEBRAL ARTERY: Antegrade flow and normal waveform in the
left vertebral artery.
IMPRESSION: Minimal atherosclerotic disease in the carotid arteries. There is no
significant carotid artery stenosis.

Patent vertebral arteries.

## 2017-07-10 DIAGNOSIS — M1288 Other specific arthropathies, not elsewhere classified, other specified site: Secondary | ICD-10-CM | POA: Diagnosis not present

## 2017-07-10 DIAGNOSIS — Z0000 Encounter for general adult medical examination without abnormal findings: Secondary | ICD-10-CM | POA: Diagnosis not present

## 2017-07-10 DIAGNOSIS — F419 Anxiety disorder, unspecified: Secondary | ICD-10-CM | POA: Diagnosis not present

## 2017-07-10 DIAGNOSIS — F5101 Primary insomnia: Secondary | ICD-10-CM | POA: Diagnosis not present

## 2017-07-10 DIAGNOSIS — M797 Fibromyalgia: Secondary | ICD-10-CM | POA: Diagnosis not present

## 2017-07-10 DIAGNOSIS — I10 Essential (primary) hypertension: Secondary | ICD-10-CM | POA: Diagnosis not present

## 2017-07-10 DIAGNOSIS — Z1389 Encounter for screening for other disorder: Secondary | ICD-10-CM | POA: Diagnosis not present

## 2017-07-15 ENCOUNTER — Other Ambulatory Visit: Payer: Self-pay | Admitting: Internal Medicine

## 2017-07-15 DIAGNOSIS — Z139 Encounter for screening, unspecified: Secondary | ICD-10-CM

## 2017-07-17 DIAGNOSIS — Z961 Presence of intraocular lens: Secondary | ICD-10-CM | POA: Diagnosis not present

## 2017-07-17 DIAGNOSIS — H26493 Other secondary cataract, bilateral: Secondary | ICD-10-CM | POA: Diagnosis not present

## 2017-07-17 DIAGNOSIS — H16223 Keratoconjunctivitis sicca, not specified as Sjogren's, bilateral: Secondary | ICD-10-CM | POA: Diagnosis not present

## 2017-07-17 DIAGNOSIS — H0011 Chalazion right upper eyelid: Secondary | ICD-10-CM | POA: Diagnosis not present

## 2017-07-17 DIAGNOSIS — H43811 Vitreous degeneration, right eye: Secondary | ICD-10-CM | POA: Diagnosis not present

## 2017-07-17 DIAGNOSIS — H00025 Hordeolum internum left lower eyelid: Secondary | ICD-10-CM | POA: Diagnosis not present

## 2017-07-25 DIAGNOSIS — G5701 Lesion of sciatic nerve, right lower limb: Secondary | ICD-10-CM | POA: Diagnosis not present

## 2017-07-25 DIAGNOSIS — M545 Low back pain: Secondary | ICD-10-CM | POA: Diagnosis not present

## 2017-08-01 ENCOUNTER — Ambulatory Visit
Admission: RE | Admit: 2017-08-01 | Discharge: 2017-08-01 | Disposition: A | Discharge status: Home / Self Care | Payer: PPO | Source: Ambulatory Visit | Attending: Internal Medicine | Admitting: Internal Medicine

## 2017-08-01 DIAGNOSIS — Z139 Encounter for screening, unspecified: Secondary | ICD-10-CM

## 2017-08-01 DIAGNOSIS — Z1231 Encounter for screening mammogram for malignant neoplasm of breast: Secondary | ICD-10-CM | POA: Diagnosis not present

## 2017-08-02 ENCOUNTER — Telehealth: Payer: Self-pay | Admitting: Gastroenterology

## 2017-08-02 NOTE — Telephone Encounter (Signed)
The pt states she has some abd pain and has a friend that passed away from pancreatic cancer and she is worried.  She was advised to f/u with PCP and they can refer to our office if needed.

## 2017-08-02 NOTE — Telephone Encounter (Signed)
Pt experiencing stomach pain, would like a cb please.

## 2017-08-12 DIAGNOSIS — R1084 Generalized abdominal pain: Secondary | ICD-10-CM | POA: Diagnosis not present

## 2017-08-12 DIAGNOSIS — K5900 Constipation, unspecified: Secondary | ICD-10-CM | POA: Diagnosis not present

## 2017-08-22 DIAGNOSIS — M4316 Spondylolisthesis, lumbar region: Secondary | ICD-10-CM | POA: Diagnosis not present

## 2017-08-22 DIAGNOSIS — G5701 Lesion of sciatic nerve, right lower limb: Secondary | ICD-10-CM | POA: Diagnosis not present

## 2017-08-22 DIAGNOSIS — M545 Low back pain: Secondary | ICD-10-CM | POA: Diagnosis not present

## 2017-08-23 DIAGNOSIS — Z85828 Personal history of other malignant neoplasm of skin: Secondary | ICD-10-CM | POA: Diagnosis not present

## 2017-08-23 DIAGNOSIS — L820 Inflamed seborrheic keratosis: Secondary | ICD-10-CM | POA: Diagnosis not present

## 2017-08-23 DIAGNOSIS — D0461 Carcinoma in situ of skin of right upper limb, including shoulder: Secondary | ICD-10-CM | POA: Diagnosis not present

## 2017-08-23 DIAGNOSIS — D485 Neoplasm of uncertain behavior of skin: Secondary | ICD-10-CM | POA: Diagnosis not present

## 2017-08-23 DIAGNOSIS — D0462 Carcinoma in situ of skin of left upper limb, including shoulder: Secondary | ICD-10-CM | POA: Diagnosis not present

## 2017-08-23 DIAGNOSIS — L570 Actinic keratosis: Secondary | ICD-10-CM | POA: Diagnosis not present

## 2017-08-23 DIAGNOSIS — L821 Other seborrheic keratosis: Secondary | ICD-10-CM | POA: Diagnosis not present

## 2017-10-24 DIAGNOSIS — M7918 Myalgia, other site: Secondary | ICD-10-CM | POA: Diagnosis not present

## 2017-10-29 DIAGNOSIS — M545 Low back pain: Secondary | ICD-10-CM | POA: Diagnosis not present

## 2017-10-29 DIAGNOSIS — M25551 Pain in right hip: Secondary | ICD-10-CM | POA: Diagnosis not present

## 2017-10-29 DIAGNOSIS — M25552 Pain in left hip: Secondary | ICD-10-CM | POA: Diagnosis not present

## 2017-11-11 DIAGNOSIS — M545 Low back pain: Secondary | ICD-10-CM | POA: Diagnosis not present

## 2017-11-22 DIAGNOSIS — M5432 Sciatica, left side: Secondary | ICD-10-CM | POA: Diagnosis not present

## 2017-12-02 DIAGNOSIS — M5416 Radiculopathy, lumbar region: Secondary | ICD-10-CM | POA: Diagnosis not present

## 2017-12-02 DIAGNOSIS — M545 Low back pain: Secondary | ICD-10-CM | POA: Diagnosis not present

## 2017-12-02 DIAGNOSIS — M4316 Spondylolisthesis, lumbar region: Secondary | ICD-10-CM | POA: Diagnosis not present

## 2017-12-16 DIAGNOSIS — M4316 Spondylolisthesis, lumbar region: Secondary | ICD-10-CM | POA: Diagnosis not present

## 2017-12-16 DIAGNOSIS — M5416 Radiculopathy, lumbar region: Secondary | ICD-10-CM | POA: Diagnosis not present

## 2017-12-16 DIAGNOSIS — M545 Low back pain: Secondary | ICD-10-CM | POA: Diagnosis not present

## 2018-01-06 DIAGNOSIS — H00025 Hordeolum internum left lower eyelid: Secondary | ICD-10-CM | POA: Diagnosis not present

## 2018-01-06 DIAGNOSIS — T1512XA Foreign body in conjunctival sac, left eye, initial encounter: Secondary | ICD-10-CM | POA: Diagnosis not present

## 2018-01-06 DIAGNOSIS — H43811 Vitreous degeneration, right eye: Secondary | ICD-10-CM | POA: Diagnosis not present

## 2018-01-06 DIAGNOSIS — H0011 Chalazion right upper eyelid: Secondary | ICD-10-CM | POA: Diagnosis not present

## 2018-01-06 DIAGNOSIS — M545 Low back pain: Secondary | ICD-10-CM | POA: Diagnosis not present

## 2018-01-06 DIAGNOSIS — M4316 Spondylolisthesis, lumbar region: Secondary | ICD-10-CM | POA: Diagnosis not present

## 2018-01-06 DIAGNOSIS — H16223 Keratoconjunctivitis sicca, not specified as Sjogren's, bilateral: Secondary | ICD-10-CM | POA: Diagnosis not present

## 2018-01-06 DIAGNOSIS — H26493 Other secondary cataract, bilateral: Secondary | ICD-10-CM | POA: Diagnosis not present

## 2018-01-06 DIAGNOSIS — Z961 Presence of intraocular lens: Secondary | ICD-10-CM | POA: Diagnosis not present

## 2018-02-27 DIAGNOSIS — Z85828 Personal history of other malignant neoplasm of skin: Secondary | ICD-10-CM | POA: Diagnosis not present

## 2018-02-27 DIAGNOSIS — D485 Neoplasm of uncertain behavior of skin: Secondary | ICD-10-CM | POA: Diagnosis not present

## 2018-02-27 DIAGNOSIS — D0461 Carcinoma in situ of skin of right upper limb, including shoulder: Secondary | ICD-10-CM | POA: Diagnosis not present

## 2018-02-27 DIAGNOSIS — D2272 Melanocytic nevi of left lower limb, including hip: Secondary | ICD-10-CM | POA: Diagnosis not present

## 2018-02-27 DIAGNOSIS — L821 Other seborrheic keratosis: Secondary | ICD-10-CM | POA: Diagnosis not present

## 2018-02-27 DIAGNOSIS — L812 Freckles: Secondary | ICD-10-CM | POA: Diagnosis not present

## 2018-02-27 DIAGNOSIS — D225 Melanocytic nevi of trunk: Secondary | ICD-10-CM | POA: Diagnosis not present

## 2018-02-27 DIAGNOSIS — L570 Actinic keratosis: Secondary | ICD-10-CM | POA: Diagnosis not present

## 2018-02-27 DIAGNOSIS — C44622 Squamous cell carcinoma of skin of right upper limb, including shoulder: Secondary | ICD-10-CM | POA: Diagnosis not present

## 2018-03-03 DIAGNOSIS — N261 Atrophy of kidney (terminal): Secondary | ICD-10-CM | POA: Diagnosis not present

## 2018-03-03 DIAGNOSIS — M47816 Spondylosis without myelopathy or radiculopathy, lumbar region: Secondary | ICD-10-CM | POA: Diagnosis not present

## 2018-03-03 DIAGNOSIS — I10 Essential (primary) hypertension: Secondary | ICD-10-CM | POA: Diagnosis not present

## 2018-03-03 DIAGNOSIS — M152 Bouchard's nodes (with arthropathy): Secondary | ICD-10-CM | POA: Diagnosis not present

## 2018-03-20 DIAGNOSIS — Z23 Encounter for immunization: Secondary | ICD-10-CM | POA: Diagnosis not present

## 2018-07-17 DIAGNOSIS — M1288 Other specific arthropathies, not elsewhere classified, other specified site: Secondary | ICD-10-CM | POA: Diagnosis not present

## 2018-07-17 DIAGNOSIS — Z1389 Encounter for screening for other disorder: Secondary | ICD-10-CM | POA: Diagnosis not present

## 2018-07-17 DIAGNOSIS — E041 Nontoxic single thyroid nodule: Secondary | ICD-10-CM | POA: Diagnosis not present

## 2018-07-17 DIAGNOSIS — K5900 Constipation, unspecified: Secondary | ICD-10-CM | POA: Diagnosis not present

## 2018-07-17 DIAGNOSIS — Z8673 Personal history of transient ischemic attack (TIA), and cerebral infarction without residual deficits: Secondary | ICD-10-CM | POA: Diagnosis not present

## 2018-07-17 DIAGNOSIS — M797 Fibromyalgia: Secondary | ICD-10-CM | POA: Diagnosis not present

## 2018-07-17 DIAGNOSIS — I10 Essential (primary) hypertension: Secondary | ICD-10-CM | POA: Diagnosis not present

## 2018-07-17 DIAGNOSIS — Z0000 Encounter for general adult medical examination without abnormal findings: Secondary | ICD-10-CM | POA: Diagnosis not present

## 2018-07-23 DIAGNOSIS — H43813 Vitreous degeneration, bilateral: Secondary | ICD-10-CM | POA: Diagnosis not present

## 2018-07-23 DIAGNOSIS — Z961 Presence of intraocular lens: Secondary | ICD-10-CM | POA: Diagnosis not present

## 2018-07-23 DIAGNOSIS — H0102A Squamous blepharitis right eye, upper and lower eyelids: Secondary | ICD-10-CM | POA: Diagnosis not present

## 2018-07-23 DIAGNOSIS — H0102B Squamous blepharitis left eye, upper and lower eyelids: Secondary | ICD-10-CM | POA: Diagnosis not present

## 2018-07-23 DIAGNOSIS — H04123 Dry eye syndrome of bilateral lacrimal glands: Secondary | ICD-10-CM | POA: Diagnosis not present

## 2018-11-27 DIAGNOSIS — C44329 Squamous cell carcinoma of skin of other parts of face: Secondary | ICD-10-CM | POA: Diagnosis not present

## 2018-11-27 DIAGNOSIS — L821 Other seborrheic keratosis: Secondary | ICD-10-CM | POA: Diagnosis not present

## 2018-11-27 DIAGNOSIS — L570 Actinic keratosis: Secondary | ICD-10-CM | POA: Diagnosis not present

## 2018-11-27 DIAGNOSIS — L812 Freckles: Secondary | ICD-10-CM | POA: Diagnosis not present

## 2018-11-27 DIAGNOSIS — D485 Neoplasm of uncertain behavior of skin: Secondary | ICD-10-CM | POA: Diagnosis not present

## 2018-11-27 DIAGNOSIS — D0462 Carcinoma in situ of skin of left upper limb, including shoulder: Secondary | ICD-10-CM | POA: Diagnosis not present

## 2018-11-27 DIAGNOSIS — Z85828 Personal history of other malignant neoplasm of skin: Secondary | ICD-10-CM | POA: Diagnosis not present

## 2018-11-27 DIAGNOSIS — D1801 Hemangioma of skin and subcutaneous tissue: Secondary | ICD-10-CM | POA: Diagnosis not present

## 2018-11-27 DIAGNOSIS — D0472 Carcinoma in situ of skin of left lower limb, including hip: Secondary | ICD-10-CM | POA: Diagnosis not present

## 2018-12-18 ENCOUNTER — Encounter: Payer: Self-pay | Admitting: Podiatry

## 2018-12-18 ENCOUNTER — Other Ambulatory Visit: Payer: Self-pay

## 2018-12-18 ENCOUNTER — Ambulatory Visit: Payer: PPO | Admitting: Podiatry

## 2018-12-18 ENCOUNTER — Ambulatory Visit (INDEPENDENT_AMBULATORY_CARE_PROVIDER_SITE_OTHER): Payer: PPO

## 2018-12-18 VITALS — Temp 97.9°F

## 2018-12-18 DIAGNOSIS — S92015A Nondisplaced fracture of body of left calcaneus, initial encounter for closed fracture: Secondary | ICD-10-CM | POA: Diagnosis not present

## 2018-12-18 DIAGNOSIS — M722 Plantar fascial fibromatosis: Secondary | ICD-10-CM

## 2018-12-18 DIAGNOSIS — C44329 Squamous cell carcinoma of skin of other parts of face: Secondary | ICD-10-CM | POA: Diagnosis not present

## 2018-12-18 DIAGNOSIS — Z85828 Personal history of other malignant neoplasm of skin: Secondary | ICD-10-CM | POA: Diagnosis not present

## 2018-12-18 MED ORDER — TRAMADOL HCL 50 MG PO TABS: 50.0000 mg | ORAL_TABLET | Freq: Three times a day (TID) | ORAL | 0 refills | Status: DC | PRN

## 2018-12-18 MED ORDER — METHYLPREDNISOLONE 4 MG PO TBPK: ORAL_TABLET | ORAL | 0 refills | Status: DC

## 2018-12-18 NOTE — Progress Notes (Signed)
She presents today chief complaint of severe right heel pain on the bottom.  She states that she is been doing a lot outside over the past weekend she states that she woke up Tuesday morning with severe pain in the right heel plantarly.  She denies history of gout denies history of any trauma.  Objective: Vital signs are stable alert and oriented x3.  Pulses are palpable.  Radiographs taken today demonstrate some soft tissue swelling along the plantar aspect of the calcaneal area also demonstrates what appears to be a small cortical interruption possibly a stress fracture.  She does have pain on palpation of the area which is warm to the touch but also on medial and lateral compression of the calcaneus is exquisitely tender.  Assessment: Stress fracture of the calcaneus cannot rule out seropositive arthropathy.  Plan: At this point start on a Medrol Dosepak to be followed by meloxicam placed her in a cam walker did not inject her at this time.  We will follow-up with her in a couple of weeks for another set of x-rays.  If she is not improved blood work will be performed at that time

## 2018-12-19 ENCOUNTER — Telehealth: Payer: Self-pay | Admitting: *Deleted

## 2018-12-19 ENCOUNTER — Telehealth: Payer: Self-pay | Admitting: Podiatry

## 2018-12-19 MED ORDER — MELOXICAM 15 MG PO TABS: 15.0000 mg | ORAL_TABLET | Freq: Every day | ORAL | 0 refills | Status: DC

## 2018-12-19 NOTE — Telephone Encounter (Signed)
Pt was seen in office yesterday and was prescribed Prednisone but forgot to tell the doctor she is not able to take that particular medication. Pt would like to know if the doctor can prescribed something else for her to take.

## 2018-12-19 NOTE — Telephone Encounter (Signed)
Pt states she received prednisone and is unable to take due to hypers her up and would like an alternate medication.

## 2018-12-19 NOTE — Telephone Encounter (Signed)
Previous message routed to doctor-on-call.

## 2018-12-19 NOTE — Telephone Encounter (Signed)
I informed pt of Dr. Leigh [place] orders and pt states she is able to take antiinflammatories.

## 2018-12-19 NOTE — Telephone Encounter (Signed)
If she can take NSAIDs we can do Mobic 15mg  daily #14 no refills

## 2018-12-25 DIAGNOSIS — Z4802 Encounter for removal of sutures: Secondary | ICD-10-CM | POA: Diagnosis not present

## 2019-01-01 ENCOUNTER — Other Ambulatory Visit: Payer: Self-pay

## 2019-01-01 ENCOUNTER — Encounter: Payer: Self-pay | Admitting: Podiatry

## 2019-01-01 ENCOUNTER — Ambulatory Visit: Payer: PPO | Admitting: Podiatry

## 2019-01-01 ENCOUNTER — Ambulatory Visit (INDEPENDENT_AMBULATORY_CARE_PROVIDER_SITE_OTHER): Payer: PPO

## 2019-01-01 VITALS — Temp 98.0°F

## 2019-01-01 DIAGNOSIS — S92015D Nondisplaced fracture of body of left calcaneus, subsequent encounter for fracture with routine healing: Secondary | ICD-10-CM

## 2019-01-01 DIAGNOSIS — M722 Plantar fascial fibromatosis: Secondary | ICD-10-CM | POA: Diagnosis not present

## 2019-01-01 NOTE — Progress Notes (Signed)
She presents today for follow-up of stress fracture of the calcaneus left.  States that it is better but I cannot get a shot.  She still states that she is wearing the boot.  Objective: Vital signs are stable she alert oriented x3 she does have pain on palpation of the medial band of the plantar fascial insertion site that she still has pain on medial and lateral compression of the calcaneus.  Radiographs taken today demonstrate what appears to be no change in the cortical fracture site.  But increase in soft tissue density at the plantar fascial cannula insertion site.  Assessment: Slowly healing crack to the calcaneus.  Plan fasciitis.  Plan: I injected the area today with 10 mg Kenalog 5 mg Marcaine for maximal tenderness.  Making sure not to get too close to the bone itself.  Follow-up with her in a month

## 2019-02-16 DIAGNOSIS — E2839 Other primary ovarian failure: Secondary | ICD-10-CM | POA: Diagnosis not present

## 2019-02-16 DIAGNOSIS — Z1231 Encounter for screening mammogram for malignant neoplasm of breast: Secondary | ICD-10-CM | POA: Diagnosis not present

## 2019-02-16 DIAGNOSIS — M8588 Other specified disorders of bone density and structure, other site: Secondary | ICD-10-CM | POA: Diagnosis not present

## 2019-02-20 DIAGNOSIS — N261 Atrophy of kidney (terminal): Secondary | ICD-10-CM | POA: Diagnosis not present

## 2019-02-20 DIAGNOSIS — M47816 Spondylosis without myelopathy or radiculopathy, lumbar region: Secondary | ICD-10-CM | POA: Diagnosis not present

## 2019-02-20 DIAGNOSIS — I10 Essential (primary) hypertension: Secondary | ICD-10-CM | POA: Diagnosis not present

## 2019-02-20 DIAGNOSIS — E7800 Pure hypercholesterolemia, unspecified: Secondary | ICD-10-CM | POA: Diagnosis not present

## 2019-02-20 DIAGNOSIS — M152 Bouchard's nodes (with arthropathy): Secondary | ICD-10-CM | POA: Diagnosis not present

## 2019-02-27 ENCOUNTER — Other Ambulatory Visit: Payer: Self-pay | Admitting: Internal Medicine

## 2019-02-27 DIAGNOSIS — Z1231 Encounter for screening mammogram for malignant neoplasm of breast: Secondary | ICD-10-CM

## 2019-02-27 DIAGNOSIS — E2839 Other primary ovarian failure: Secondary | ICD-10-CM

## 2019-03-23 ENCOUNTER — Other Ambulatory Visit: Payer: Self-pay

## 2019-03-23 DIAGNOSIS — Z20822 Contact with and (suspected) exposure to covid-19: Secondary | ICD-10-CM

## 2019-03-24 LAB — NOVEL CORONAVIRUS, NAA: SARS-CoV-2, NAA: NOT DETECTED

## 2019-03-30 ENCOUNTER — Telehealth: Payer: Self-pay | Admitting: Internal Medicine

## 2019-03-30 NOTE — Telephone Encounter (Signed)
Negative COVID results given. Patient results "NOT Detected." Caller expressed understanding. ° °

## 2019-04-08 DIAGNOSIS — N261 Atrophy of kidney (terminal): Secondary | ICD-10-CM | POA: Diagnosis not present

## 2019-04-08 DIAGNOSIS — E7800 Pure hypercholesterolemia, unspecified: Secondary | ICD-10-CM | POA: Diagnosis not present

## 2019-04-08 DIAGNOSIS — I10 Essential (primary) hypertension: Secondary | ICD-10-CM | POA: Diagnosis not present

## 2019-04-08 DIAGNOSIS — M47816 Spondylosis without myelopathy or radiculopathy, lumbar region: Secondary | ICD-10-CM | POA: Diagnosis not present

## 2019-04-08 DIAGNOSIS — M152 Bouchard's nodes (with arthropathy): Secondary | ICD-10-CM | POA: Diagnosis not present

## 2019-04-25 DIAGNOSIS — M26602 Left temporomandibular joint disorder, unspecified: Secondary | ICD-10-CM | POA: Diagnosis not present

## 2019-04-25 DIAGNOSIS — H6980 Other specified disorders of Eustachian tube, unspecified ear: Secondary | ICD-10-CM | POA: Diagnosis not present

## 2019-05-26 ENCOUNTER — Ambulatory Visit
Admission: RE | Admit: 2019-05-26 | Discharge: 2019-05-26 | Disposition: A | Discharge status: Home / Self Care | Payer: PPO | Source: Ambulatory Visit | Attending: Internal Medicine | Admitting: Internal Medicine

## 2019-05-26 ENCOUNTER — Other Ambulatory Visit: Payer: Self-pay

## 2019-05-26 ENCOUNTER — Ambulatory Visit: Payer: PPO

## 2019-05-26 ENCOUNTER — Other Ambulatory Visit: Payer: PPO

## 2019-05-26 DIAGNOSIS — Z1231 Encounter for screening mammogram for malignant neoplasm of breast: Secondary | ICD-10-CM

## 2019-05-26 DIAGNOSIS — Z780 Asymptomatic menopausal state: Secondary | ICD-10-CM | POA: Diagnosis not present

## 2019-05-26 DIAGNOSIS — M85852 Other specified disorders of bone density and structure, left thigh: Secondary | ICD-10-CM | POA: Diagnosis not present

## 2019-05-26 DIAGNOSIS — E2839 Other primary ovarian failure: Secondary | ICD-10-CM

## 2019-05-26 LAB — HM MAMMOGRAPHY

## 2019-05-26 LAB — HM DEXA SCAN

## 2019-05-29 DIAGNOSIS — I10 Essential (primary) hypertension: Secondary | ICD-10-CM | POA: Diagnosis not present

## 2019-05-29 DIAGNOSIS — N261 Atrophy of kidney (terminal): Secondary | ICD-10-CM | POA: Diagnosis not present

## 2019-05-29 DIAGNOSIS — M152 Bouchard's nodes (with arthropathy): Secondary | ICD-10-CM | POA: Diagnosis not present

## 2019-05-29 DIAGNOSIS — M47816 Spondylosis without myelopathy or radiculopathy, lumbar region: Secondary | ICD-10-CM | POA: Diagnosis not present

## 2019-05-29 DIAGNOSIS — E7800 Pure hypercholesterolemia, unspecified: Secondary | ICD-10-CM | POA: Diagnosis not present

## 2019-06-01 DIAGNOSIS — L218 Other seborrheic dermatitis: Secondary | ICD-10-CM | POA: Diagnosis not present

## 2019-06-01 DIAGNOSIS — D1801 Hemangioma of skin and subcutaneous tissue: Secondary | ICD-10-CM | POA: Diagnosis not present

## 2019-06-01 DIAGNOSIS — L570 Actinic keratosis: Secondary | ICD-10-CM | POA: Diagnosis not present

## 2019-06-01 DIAGNOSIS — L812 Freckles: Secondary | ICD-10-CM | POA: Diagnosis not present

## 2019-06-01 DIAGNOSIS — Z85828 Personal history of other malignant neoplasm of skin: Secondary | ICD-10-CM | POA: Diagnosis not present

## 2019-06-01 DIAGNOSIS — L821 Other seborrheic keratosis: Secondary | ICD-10-CM | POA: Diagnosis not present

## 2019-07-20 DIAGNOSIS — Z0000 Encounter for general adult medical examination without abnormal findings: Secondary | ICD-10-CM | POA: Diagnosis not present

## 2019-07-20 DIAGNOSIS — M8588 Other specified disorders of bone density and structure, other site: Secondary | ICD-10-CM | POA: Diagnosis not present

## 2019-07-20 DIAGNOSIS — Z1389 Encounter for screening for other disorder: Secondary | ICD-10-CM | POA: Diagnosis not present

## 2019-07-20 DIAGNOSIS — I10 Essential (primary) hypertension: Secondary | ICD-10-CM | POA: Diagnosis not present

## 2019-07-20 DIAGNOSIS — M1288 Other specific arthropathies, not elsewhere classified, other specified site: Secondary | ICD-10-CM | POA: Diagnosis not present

## 2019-07-20 DIAGNOSIS — K5900 Constipation, unspecified: Secondary | ICD-10-CM | POA: Diagnosis not present

## 2019-07-20 DIAGNOSIS — M797 Fibromyalgia: Secondary | ICD-10-CM | POA: Diagnosis not present

## 2019-07-20 DIAGNOSIS — R202 Paresthesia of skin: Secondary | ICD-10-CM | POA: Diagnosis not present

## 2019-07-20 LAB — CBC: RBC: 4.38 (ref 3.87–5.11)

## 2019-07-20 LAB — CBC AND DIFFERENTIAL
HCT: 37 (ref 36–46)
Hemoglobin: 12.3 (ref 12.0–16.0)
Neutrophils Absolute: 3.1
Platelets: 348 10*3/uL (ref 150–400)
WBC: 5.7

## 2019-07-20 LAB — BASIC METABOLIC PANEL
BUN: 13 (ref 4–21)
CO2: 28 — AB (ref 13–22)
Chloride: 99 (ref 99–108)
Creatinine: 0.7 (ref 0.5–1.1)
Glucose: 116
Potassium: 4.2 mEq/L (ref 3.5–5.1)
Sodium: 136 — AB (ref 137–147)

## 2019-07-20 LAB — HEPATIC FUNCTION PANEL
ALT: 16 U/L (ref 7–35)
AST: 24 (ref 13–35)

## 2019-07-20 LAB — COMPREHENSIVE METABOLIC PANEL
Albumin: 4.7 (ref 3.5–5.0)
Calcium: 10.1 (ref 8.7–10.7)
eGFR: 77

## 2019-07-21 DIAGNOSIS — I10 Essential (primary) hypertension: Secondary | ICD-10-CM | POA: Diagnosis not present

## 2019-07-29 DIAGNOSIS — Z9889 Other specified postprocedural states: Secondary | ICD-10-CM | POA: Diagnosis not present

## 2019-07-29 DIAGNOSIS — H0102B Squamous blepharitis left eye, upper and lower eyelids: Secondary | ICD-10-CM | POA: Diagnosis not present

## 2019-07-29 DIAGNOSIS — H0102A Squamous blepharitis right eye, upper and lower eyelids: Secondary | ICD-10-CM | POA: Diagnosis not present

## 2019-07-29 DIAGNOSIS — Z961 Presence of intraocular lens: Secondary | ICD-10-CM | POA: Diagnosis not present

## 2019-07-29 DIAGNOSIS — H43813 Vitreous degeneration, bilateral: Secondary | ICD-10-CM | POA: Diagnosis not present

## 2019-07-29 DIAGNOSIS — H04123 Dry eye syndrome of bilateral lacrimal glands: Secondary | ICD-10-CM | POA: Diagnosis not present

## 2019-09-17 DIAGNOSIS — I10 Essential (primary) hypertension: Secondary | ICD-10-CM | POA: Diagnosis not present

## 2019-09-17 DIAGNOSIS — M47816 Spondylosis without myelopathy or radiculopathy, lumbar region: Secondary | ICD-10-CM | POA: Diagnosis not present

## 2019-09-17 DIAGNOSIS — N261 Atrophy of kidney (terminal): Secondary | ICD-10-CM | POA: Diagnosis not present

## 2019-09-17 DIAGNOSIS — M152 Bouchard's nodes (with arthropathy): Secondary | ICD-10-CM | POA: Diagnosis not present

## 2019-09-17 DIAGNOSIS — E7800 Pure hypercholesterolemia, unspecified: Secondary | ICD-10-CM | POA: Diagnosis not present

## 2019-11-18 DIAGNOSIS — M152 Bouchard's nodes (with arthropathy): Secondary | ICD-10-CM | POA: Diagnosis not present

## 2019-11-18 DIAGNOSIS — N261 Atrophy of kidney (terminal): Secondary | ICD-10-CM | POA: Diagnosis not present

## 2019-11-18 DIAGNOSIS — I10 Essential (primary) hypertension: Secondary | ICD-10-CM | POA: Diagnosis not present

## 2019-11-18 DIAGNOSIS — E7800 Pure hypercholesterolemia, unspecified: Secondary | ICD-10-CM | POA: Diagnosis not present

## 2019-11-18 DIAGNOSIS — M47816 Spondylosis without myelopathy or radiculopathy, lumbar region: Secondary | ICD-10-CM | POA: Diagnosis not present

## 2019-11-30 DIAGNOSIS — L812 Freckles: Secondary | ICD-10-CM | POA: Diagnosis not present

## 2019-11-30 DIAGNOSIS — L821 Other seborrheic keratosis: Secondary | ICD-10-CM | POA: Diagnosis not present

## 2019-11-30 DIAGNOSIS — Z85828 Personal history of other malignant neoplasm of skin: Secondary | ICD-10-CM | POA: Diagnosis not present

## 2019-11-30 DIAGNOSIS — D0462 Carcinoma in situ of skin of left upper limb, including shoulder: Secondary | ICD-10-CM | POA: Diagnosis not present

## 2019-11-30 DIAGNOSIS — L570 Actinic keratosis: Secondary | ICD-10-CM | POA: Diagnosis not present

## 2019-11-30 DIAGNOSIS — D485 Neoplasm of uncertain behavior of skin: Secondary | ICD-10-CM | POA: Diagnosis not present

## 2019-11-30 DIAGNOSIS — I788 Other diseases of capillaries: Secondary | ICD-10-CM | POA: Diagnosis not present

## 2019-12-31 DIAGNOSIS — M152 Bouchard's nodes (with arthropathy): Secondary | ICD-10-CM | POA: Diagnosis not present

## 2019-12-31 DIAGNOSIS — N261 Atrophy of kidney (terminal): Secondary | ICD-10-CM | POA: Diagnosis not present

## 2019-12-31 DIAGNOSIS — M47816 Spondylosis without myelopathy or radiculopathy, lumbar region: Secondary | ICD-10-CM | POA: Diagnosis not present

## 2019-12-31 DIAGNOSIS — I10 Essential (primary) hypertension: Secondary | ICD-10-CM | POA: Diagnosis not present

## 2019-12-31 DIAGNOSIS — E7800 Pure hypercholesterolemia, unspecified: Secondary | ICD-10-CM | POA: Diagnosis not present

## 2020-01-22 DIAGNOSIS — I10 Essential (primary) hypertension: Secondary | ICD-10-CM | POA: Diagnosis not present

## 2020-01-22 DIAGNOSIS — R202 Paresthesia of skin: Secondary | ICD-10-CM | POA: Diagnosis not present

## 2020-01-22 DIAGNOSIS — M1288 Other specific arthropathies, not elsewhere classified, other specified site: Secondary | ICD-10-CM | POA: Diagnosis not present

## 2020-04-19 DIAGNOSIS — I10 Essential (primary) hypertension: Secondary | ICD-10-CM | POA: Diagnosis not present

## 2020-04-19 DIAGNOSIS — N261 Atrophy of kidney (terminal): Secondary | ICD-10-CM | POA: Diagnosis not present

## 2020-04-19 DIAGNOSIS — M152 Bouchard's nodes (with arthropathy): Secondary | ICD-10-CM | POA: Diagnosis not present

## 2020-04-19 DIAGNOSIS — E7800 Pure hypercholesterolemia, unspecified: Secondary | ICD-10-CM | POA: Diagnosis not present

## 2020-04-19 DIAGNOSIS — M47816 Spondylosis without myelopathy or radiculopathy, lumbar region: Secondary | ICD-10-CM | POA: Diagnosis not present

## 2020-04-19 DIAGNOSIS — G4700 Insomnia, unspecified: Secondary | ICD-10-CM | POA: Diagnosis not present

## 2020-04-19 DIAGNOSIS — G8929 Other chronic pain: Secondary | ICD-10-CM | POA: Diagnosis not present

## 2020-06-01 DIAGNOSIS — C44619 Basal cell carcinoma of skin of left upper limb, including shoulder: Secondary | ICD-10-CM | POA: Diagnosis not present

## 2020-06-01 DIAGNOSIS — L814 Other melanin hyperpigmentation: Secondary | ICD-10-CM | POA: Diagnosis not present

## 2020-06-01 DIAGNOSIS — D1801 Hemangioma of skin and subcutaneous tissue: Secondary | ICD-10-CM | POA: Diagnosis not present

## 2020-06-01 DIAGNOSIS — D225 Melanocytic nevi of trunk: Secondary | ICD-10-CM | POA: Diagnosis not present

## 2020-06-01 DIAGNOSIS — L821 Other seborrheic keratosis: Secondary | ICD-10-CM | POA: Diagnosis not present

## 2020-06-01 DIAGNOSIS — Z85828 Personal history of other malignant neoplasm of skin: Secondary | ICD-10-CM | POA: Diagnosis not present

## 2020-06-01 DIAGNOSIS — D485 Neoplasm of uncertain behavior of skin: Secondary | ICD-10-CM | POA: Diagnosis not present

## 2020-06-01 DIAGNOSIS — L812 Freckles: Secondary | ICD-10-CM | POA: Diagnosis not present

## 2020-07-08 DIAGNOSIS — I10 Essential (primary) hypertension: Secondary | ICD-10-CM | POA: Diagnosis not present

## 2020-07-08 DIAGNOSIS — G8929 Other chronic pain: Secondary | ICD-10-CM | POA: Diagnosis not present

## 2020-07-08 DIAGNOSIS — N261 Atrophy of kidney (terminal): Secondary | ICD-10-CM | POA: Diagnosis not present

## 2020-07-08 DIAGNOSIS — M47816 Spondylosis without myelopathy or radiculopathy, lumbar region: Secondary | ICD-10-CM | POA: Diagnosis not present

## 2020-07-08 DIAGNOSIS — M152 Bouchard's nodes (with arthropathy): Secondary | ICD-10-CM | POA: Diagnosis not present

## 2020-07-08 DIAGNOSIS — E7800 Pure hypercholesterolemia, unspecified: Secondary | ICD-10-CM | POA: Diagnosis not present

## 2020-07-08 DIAGNOSIS — G4700 Insomnia, unspecified: Secondary | ICD-10-CM | POA: Diagnosis not present

## 2020-07-27 DIAGNOSIS — K5900 Constipation, unspecified: Secondary | ICD-10-CM | POA: Diagnosis not present

## 2020-07-27 DIAGNOSIS — I10 Essential (primary) hypertension: Secondary | ICD-10-CM | POA: Diagnosis not present

## 2020-07-27 DIAGNOSIS — E7800 Pure hypercholesterolemia, unspecified: Secondary | ICD-10-CM | POA: Diagnosis not present

## 2020-07-27 DIAGNOSIS — Z0000 Encounter for general adult medical examination without abnormal findings: Secondary | ICD-10-CM | POA: Diagnosis not present

## 2020-07-27 DIAGNOSIS — Z1389 Encounter for screening for other disorder: Secondary | ICD-10-CM | POA: Diagnosis not present

## 2020-07-27 DIAGNOSIS — M8588 Other specified disorders of bone density and structure, other site: Secondary | ICD-10-CM | POA: Diagnosis not present

## 2020-07-27 DIAGNOSIS — R1011 Right upper quadrant pain: Secondary | ICD-10-CM | POA: Diagnosis not present

## 2020-07-27 DIAGNOSIS — M1288 Other specific arthropathies, not elsewhere classified, other specified site: Secondary | ICD-10-CM | POA: Diagnosis not present

## 2020-07-27 DIAGNOSIS — Z8673 Personal history of transient ischemic attack (TIA), and cerebral infarction without residual deficits: Secondary | ICD-10-CM | POA: Diagnosis not present

## 2020-07-27 LAB — LIPID PANEL
Cholesterol: 255 — AB (ref 0–200)
HDL: 113 — AB (ref 35–70)
LDL Cholesterol: 120
LDl/HDL Ratio: 2.3
Triglycerides: 133 (ref 40–160)

## 2020-07-29 DIAGNOSIS — Z9889 Other specified postprocedural states: Secondary | ICD-10-CM | POA: Diagnosis not present

## 2020-07-29 DIAGNOSIS — H0102A Squamous blepharitis right eye, upper and lower eyelids: Secondary | ICD-10-CM | POA: Diagnosis not present

## 2020-07-29 DIAGNOSIS — H04123 Dry eye syndrome of bilateral lacrimal glands: Secondary | ICD-10-CM | POA: Diagnosis not present

## 2020-07-29 DIAGNOSIS — H43813 Vitreous degeneration, bilateral: Secondary | ICD-10-CM | POA: Diagnosis not present

## 2020-07-29 DIAGNOSIS — H0102B Squamous blepharitis left eye, upper and lower eyelids: Secondary | ICD-10-CM | POA: Diagnosis not present

## 2020-07-29 DIAGNOSIS — Z961 Presence of intraocular lens: Secondary | ICD-10-CM | POA: Diagnosis not present

## 2020-09-15 ENCOUNTER — Encounter: Payer: Self-pay | Admitting: Gastroenterology

## 2020-10-03 DIAGNOSIS — M4316 Spondylolisthesis, lumbar region: Secondary | ICD-10-CM | POA: Diagnosis not present

## 2020-10-03 DIAGNOSIS — M25551 Pain in right hip: Secondary | ICD-10-CM | POA: Diagnosis not present

## 2020-10-05 DIAGNOSIS — M545 Low back pain, unspecified: Secondary | ICD-10-CM | POA: Diagnosis not present

## 2020-10-11 DIAGNOSIS — M4316 Spondylolisthesis, lumbar region: Secondary | ICD-10-CM | POA: Diagnosis not present

## 2020-10-11 DIAGNOSIS — M47816 Spondylosis without myelopathy or radiculopathy, lumbar region: Secondary | ICD-10-CM | POA: Diagnosis not present

## 2020-10-11 DIAGNOSIS — M545 Low back pain, unspecified: Secondary | ICD-10-CM | POA: Diagnosis not present

## 2020-10-20 DIAGNOSIS — M47816 Spondylosis without myelopathy or radiculopathy, lumbar region: Secondary | ICD-10-CM | POA: Diagnosis not present

## 2020-11-10 DIAGNOSIS — M47816 Spondylosis without myelopathy or radiculopathy, lumbar region: Secondary | ICD-10-CM | POA: Diagnosis not present

## 2020-12-14 DIAGNOSIS — G8929 Other chronic pain: Secondary | ICD-10-CM | POA: Diagnosis not present

## 2020-12-14 DIAGNOSIS — M152 Bouchard's nodes (with arthropathy): Secondary | ICD-10-CM | POA: Diagnosis not present

## 2020-12-14 DIAGNOSIS — E7800 Pure hypercholesterolemia, unspecified: Secondary | ICD-10-CM | POA: Diagnosis not present

## 2020-12-14 DIAGNOSIS — N261 Atrophy of kidney (terminal): Secondary | ICD-10-CM | POA: Diagnosis not present

## 2020-12-14 DIAGNOSIS — I10 Essential (primary) hypertension: Secondary | ICD-10-CM | POA: Diagnosis not present

## 2020-12-14 DIAGNOSIS — G4700 Insomnia, unspecified: Secondary | ICD-10-CM | POA: Diagnosis not present

## 2020-12-14 DIAGNOSIS — M47816 Spondylosis without myelopathy or radiculopathy, lumbar region: Secondary | ICD-10-CM | POA: Diagnosis not present

## 2021-01-11 DIAGNOSIS — L814 Other melanin hyperpigmentation: Secondary | ICD-10-CM | POA: Diagnosis not present

## 2021-01-11 DIAGNOSIS — L570 Actinic keratosis: Secondary | ICD-10-CM | POA: Diagnosis not present

## 2021-01-11 DIAGNOSIS — D1801 Hemangioma of skin and subcutaneous tissue: Secondary | ICD-10-CM | POA: Diagnosis not present

## 2021-01-11 DIAGNOSIS — Z85828 Personal history of other malignant neoplasm of skin: Secondary | ICD-10-CM | POA: Diagnosis not present

## 2021-01-11 DIAGNOSIS — L821 Other seborrheic keratosis: Secondary | ICD-10-CM | POA: Diagnosis not present

## 2021-01-11 DIAGNOSIS — D0462 Carcinoma in situ of skin of left upper limb, including shoulder: Secondary | ICD-10-CM | POA: Diagnosis not present

## 2021-01-24 DIAGNOSIS — M5136 Other intervertebral disc degeneration, lumbar region: Secondary | ICD-10-CM | POA: Diagnosis not present

## 2021-01-24 DIAGNOSIS — M5416 Radiculopathy, lumbar region: Secondary | ICD-10-CM | POA: Diagnosis not present

## 2021-01-24 DIAGNOSIS — M47816 Spondylosis without myelopathy or radiculopathy, lumbar region: Secondary | ICD-10-CM | POA: Diagnosis not present

## 2021-01-25 DIAGNOSIS — M4316 Spondylolisthesis, lumbar region: Secondary | ICD-10-CM | POA: Diagnosis not present

## 2021-01-25 DIAGNOSIS — M5416 Radiculopathy, lumbar region: Secondary | ICD-10-CM | POA: Diagnosis not present

## 2021-01-25 DIAGNOSIS — M47816 Spondylosis without myelopathy or radiculopathy, lumbar region: Secondary | ICD-10-CM | POA: Diagnosis not present

## 2021-02-02 DIAGNOSIS — I10 Essential (primary) hypertension: Secondary | ICD-10-CM | POA: Diagnosis not present

## 2021-02-02 DIAGNOSIS — M47816 Spondylosis without myelopathy or radiculopathy, lumbar region: Secondary | ICD-10-CM | POA: Diagnosis not present

## 2021-02-15 DIAGNOSIS — G8929 Other chronic pain: Secondary | ICD-10-CM | POA: Diagnosis not present

## 2021-02-15 DIAGNOSIS — I10 Essential (primary) hypertension: Secondary | ICD-10-CM | POA: Diagnosis not present

## 2021-02-15 DIAGNOSIS — M47816 Spondylosis without myelopathy or radiculopathy, lumbar region: Secondary | ICD-10-CM | POA: Diagnosis not present

## 2021-02-15 DIAGNOSIS — G4700 Insomnia, unspecified: Secondary | ICD-10-CM | POA: Diagnosis not present

## 2021-02-15 DIAGNOSIS — E7800 Pure hypercholesterolemia, unspecified: Secondary | ICD-10-CM | POA: Diagnosis not present

## 2021-02-21 DIAGNOSIS — M4316 Spondylolisthesis, lumbar region: Secondary | ICD-10-CM | POA: Diagnosis not present

## 2021-02-21 DIAGNOSIS — M47816 Spondylosis without myelopathy or radiculopathy, lumbar region: Secondary | ICD-10-CM | POA: Diagnosis not present

## 2021-04-19 DIAGNOSIS — M79644 Pain in right finger(s): Secondary | ICD-10-CM | POA: Diagnosis not present

## 2021-04-19 DIAGNOSIS — S60450A Superficial foreign body of right index finger, initial encounter: Secondary | ICD-10-CM | POA: Diagnosis not present

## 2021-05-18 DIAGNOSIS — M152 Bouchard's nodes (with arthropathy): Secondary | ICD-10-CM | POA: Diagnosis not present

## 2021-05-18 DIAGNOSIS — G4700 Insomnia, unspecified: Secondary | ICD-10-CM | POA: Diagnosis not present

## 2021-05-18 DIAGNOSIS — I10 Essential (primary) hypertension: Secondary | ICD-10-CM | POA: Diagnosis not present

## 2021-05-18 DIAGNOSIS — M47816 Spondylosis without myelopathy or radiculopathy, lumbar region: Secondary | ICD-10-CM | POA: Diagnosis not present

## 2021-05-18 DIAGNOSIS — N261 Atrophy of kidney (terminal): Secondary | ICD-10-CM | POA: Diagnosis not present

## 2021-05-18 DIAGNOSIS — G8929 Other chronic pain: Secondary | ICD-10-CM | POA: Diagnosis not present

## 2021-05-18 DIAGNOSIS — E7800 Pure hypercholesterolemia, unspecified: Secondary | ICD-10-CM | POA: Diagnosis not present

## 2021-07-18 DIAGNOSIS — Z85828 Personal history of other malignant neoplasm of skin: Secondary | ICD-10-CM | POA: Diagnosis not present

## 2021-07-18 DIAGNOSIS — D0471 Carcinoma in situ of skin of right lower limb, including hip: Secondary | ICD-10-CM | POA: Diagnosis not present

## 2021-07-18 DIAGNOSIS — D225 Melanocytic nevi of trunk: Secondary | ICD-10-CM | POA: Diagnosis not present

## 2021-07-18 DIAGNOSIS — L570 Actinic keratosis: Secondary | ICD-10-CM | POA: Diagnosis not present

## 2021-07-18 DIAGNOSIS — C44729 Squamous cell carcinoma of skin of left lower limb, including hip: Secondary | ICD-10-CM | POA: Diagnosis not present

## 2021-07-18 DIAGNOSIS — L812 Freckles: Secondary | ICD-10-CM | POA: Diagnosis not present

## 2021-07-18 DIAGNOSIS — D485 Neoplasm of uncertain behavior of skin: Secondary | ICD-10-CM | POA: Diagnosis not present

## 2021-07-18 DIAGNOSIS — L821 Other seborrheic keratosis: Secondary | ICD-10-CM | POA: Diagnosis not present

## 2021-08-09 DIAGNOSIS — H0102B Squamous blepharitis left eye, upper and lower eyelids: Secondary | ICD-10-CM | POA: Diagnosis not present

## 2021-08-09 DIAGNOSIS — H43813 Vitreous degeneration, bilateral: Secondary | ICD-10-CM | POA: Diagnosis not present

## 2021-08-09 DIAGNOSIS — H0102A Squamous blepharitis right eye, upper and lower eyelids: Secondary | ICD-10-CM | POA: Diagnosis not present

## 2021-08-09 DIAGNOSIS — Z961 Presence of intraocular lens: Secondary | ICD-10-CM | POA: Diagnosis not present

## 2021-08-23 ENCOUNTER — Encounter: Payer: Self-pay | Admitting: Emergency Medicine

## 2021-08-23 ENCOUNTER — Ambulatory Visit
Admission: EM | Admit: 2021-08-23 | Discharge: 2021-08-23 | Disposition: A | Discharge status: Home / Self Care | Payer: Medicare Other | Attending: Urgent Care | Admitting: Urgent Care

## 2021-08-23 ENCOUNTER — Other Ambulatory Visit: Payer: Self-pay

## 2021-08-23 DIAGNOSIS — L259 Unspecified contact dermatitis, unspecified cause: Secondary | ICD-10-CM

## 2021-08-23 DIAGNOSIS — L299 Pruritus, unspecified: Secondary | ICD-10-CM | POA: Diagnosis not present

## 2021-08-23 DIAGNOSIS — W57XXXA Bitten or stung by nonvenomous insect and other nonvenomous arthropods, initial encounter: Secondary | ICD-10-CM

## 2021-08-23 MED ORDER — TRIAMCINOLONE ACETONIDE 0.1 % EX CREA: 1.0000 "application " | TOPICAL_CREAM | Freq: Two times a day (BID) | CUTANEOUS | 0 refills | Status: DC

## 2021-08-23 NOTE — ED Provider Notes (Signed)
?[facility] ? ? ?MRN: [id number] DOB: 31-May-1940 ? ?Subjective:  ? ?Megan Lester is a 81 y.o. female presenting for persistent itching and irritation over the right hip area.  Symptoms started from a tick bite she sustained that came in from her puppy.  Denies fever, joint pains, headaches, cough, sore throat, nausea, vomiting, abdominal pain.  Has used cortisone here and there. ? ?No current facility-administered medications for this encounter. ? ?Current Outpatient Medications:  ?  acetaminophen (TYLENOL) 500 MG tablet, Take 500 mg by mouth every 6 (six) hours as needed., Disp: , Rfl:  ?  ALPRAZolam (XANAX) 0.5 MG tablet, , Disp: , Rfl:  ?  amLODipine (NORVASC) 2.5 MG tablet, Take 2.5 mg by mouth daily., Disp: , Rfl:  ?  aspirin 81 MG tablet, Take 81 mg by mouth daily., Disp: , Rfl:  ?  cycloSPORINE (RESTASIS) 0.05 % ophthalmic emulsion, 1 drop 2 (two) times daily., Disp: , Rfl:  ?  diphenhydramine-acetaminophen (TYLENOL PM) 25-500 MG TABS tablet, Take 1 tablet by mouth at bedtime as needed., Disp: , Rfl:  ?  Glucosamine-Chondroit-Vit C-Mn (GLUCOSAMINE 1500 COMPLEX PO), Take 1,500 mg by mouth daily., Disp: , Rfl:  ?  ibuprofen (ADVIL,MOTRIN) 200 MG tablet, Take 200 mg by mouth every 6 (six) hours as needed., Disp: , Rfl:  ?  Magnesium 500 MG CAPS, Take 500 mg by mouth daily., Disp: , Rfl:  ?  meloxicam (MOBIC) 15 MG tablet, Take 1 tablet (15 mg total) by mouth daily., Disp: 14 tablet, Rfl: 0 ?  methylPREDNISolone (MEDROL DOSEPAK) 4 MG TBPK tablet, 6 day dose pack - take as directed, Disp: 21 tablet, Rfl: 0 ?  Multiple Vitamins-Minerals (MULTIVITAMIN PO), Take 1 capsule by mouth daily., Disp: , Rfl:  ?  neomycin-polymyxin-hydrocortisone (CORTISPORIN) OTIC solution, Apply one to two drops to affected toe daily, Disp: 10 mL, Rfl: 0 ?  polyethylene glycol (MIRALAX) 0.34 gm/ml SOLN, Take 1 g/kg by mouth every 12 (twelve) hours., Disp: , Rfl:  ?  Probiotic Product (PROBIOTIC DAILY PO), Take 1  capsule by mouth daily., Disp: , Rfl:  ?  traMADol (ULTRAM) 50 MG tablet, Take 1 tablet (50 mg total) by mouth every 8 (eight) hours as needed., Disp: 15 tablet, Rfl: 0 ?  Turmeric 500 MG CAPS, Take 1 capsule by mouth daily., Disp: , Rfl:   ? ?Allergies  ?Allergen Reactions  ? Wound Dressing Adhesive   ?  Bandaid adhesive  ? ? ?Past Medical History:  ?Diagnosis Date  ? Allergy   ? Anxiety   ? Arthritis   ? Chronic kidney disease   ? 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 Heart murmur   ? Insomnia   ? Skin lesion of face   ? precancerous  ? Thyroid disease   ? TIA (transient ischemic attack) 02/20/2016  ? Tubular adenoma   ?  ? ?Past Surgical History:  ?Procedure Laterality Date  ? AUGMENTATION MAMMAPLASTY    ? COLONOSCOPY    ? DILATION AND CURETTAGE OF UTERUS    ? EYE SURGERY    ? both cataracts  ? INCISION AND DRAINAGE ABSCESS Right 01/27/2013  ? Procedure: INCISION AND DRAINAGE RIGHT THUMB PULP;  Surgeon: Wynonia Sours, MD;  Location: [facility];  Service: Orthopedics;  Laterality: Right;  ? VEIN LIGATION    ? ? ?Family History  ?Problem Relation Age of Onset  ? Diabetes Mother   ? Heart disease Mother   ? Heart  disease Father   ? Lung cancer Brother   ?     smoker  ? Heart disease Brother   ? Diabetes Brother   ? Heart disease Brother   ? Diabetes Sister   ?     x 2  ? Stroke Sister   ? Heart disease Brother   ?     x 3   ? Heart disease Sister   ?     x 4  ? Heart disease Sister   ? Heart disease Sister   ? Heart disease Sister   ? Colon cancer Neg Hx   ? ? ?Social History  ? ?Tobacco Use  ? Smoking status: Never  ? Smokeless tobacco: Never  ?Substance Use Topics  ? Alcohol use: No  ?  Alcohol/week: 0.0 standard drinks  ? Drug use: No  ? ? ?ROS ? ? ?Objective:  ? ?Vitals: ?BP 135/72 (BP Location: Right Arm)   Pulse 64   Temp 98.1 ?F (36.7 ?C) (Oral)   Resp 18   Ht '5\' 5"'$  (1.651 m)   Wt 125 lb (56.7 kg)   SpO2 98%   BMI 20.80 kg/m?  ? ?Physical Exam ?Constitutional:   ?    General: She is not in acute distress. ?   Appearance: Normal appearance. She is well-developed. She is not ill-appearing, toxic-appearing or diaphoretic.  ?HENT:  ?   Head: Normocephalic and atraumatic.  ?   Nose: Nose normal.  ?   Mouth/Throat:  ?   Mouth: Mucous membranes are moist.  ?Eyes:  ?   General: No scleral icterus.    ?   Right eye: No discharge.     ?   Left eye: No discharge.  ?   Extraocular Movements: Extraocular movements intact.  ?Cardiovascular:  ?   Rate and Rhythm: Normal rate.  ?Pulmonary:  ?   Effort: Pulmonary effort is normal.  ?Skin: ?   General: Skin is warm and dry.  ?   Findings: Rash (Resolving contact dermatitis over the right hip area; no tenderness, drainage of pus, induration) present.  ?Neurological:  ?   General: No focal deficit present.  ?   Mental Status: She is alert and oriented to person, place, and time.  ?Psychiatric:     ?   Mood and Affect: Mood normal.     ?   Behavior: Behavior normal.     ?   Thought Content: Thought content normal.     ?   Judgment: Judgment normal.  ? ? ? ? ?Assessment and Plan :  ? ?PDMP not reviewed this encounter. ? ?1. Contact dermatitis, unspecified contact dermatitis type, unspecified trigger   ?2. Tick bite, unspecified site, initial encounter   ?3. Itching   ? ?Recommended starting triamcinolone cream for contact dermatitis.  No concerns for tickborne illness. Counseled patient on potential for adverse effects with medications prescribed/recommended today, ER and return-to-clinic precautions discussed, patient verbalized understanding. ? ?  ?Jaynee Eagles, PA-C ?08/23/21 0920 ? ?

## 2021-08-23 NOTE — ED Triage Notes (Signed)
Pt reports removed tick from right hip x2-3 weeks ago. Pt reports site started getting red a few days ago. Pt denies any other symptoms.  ?

## 2021-08-31 DIAGNOSIS — Z8673 Personal history of transient ischemic attack (TIA), and cerebral infarction without residual deficits: Secondary | ICD-10-CM | POA: Diagnosis not present

## 2021-08-31 DIAGNOSIS — M8588 Other specified disorders of bone density and structure, other site: Secondary | ICD-10-CM | POA: Diagnosis not present

## 2021-08-31 DIAGNOSIS — H9193 Unspecified hearing loss, bilateral: Secondary | ICD-10-CM | POA: Diagnosis not present

## 2021-08-31 DIAGNOSIS — I10 Essential (primary) hypertension: Secondary | ICD-10-CM | POA: Diagnosis not present

## 2021-08-31 DIAGNOSIS — Z0000 Encounter for general adult medical examination without abnormal findings: Secondary | ICD-10-CM | POA: Diagnosis not present

## 2021-08-31 DIAGNOSIS — Z1389 Encounter for screening for other disorder: Secondary | ICD-10-CM | POA: Diagnosis not present

## 2021-08-31 DIAGNOSIS — K5900 Constipation, unspecified: Secondary | ICD-10-CM | POA: Diagnosis not present

## 2021-08-31 DIAGNOSIS — M1288 Other specific arthropathies, not elsewhere classified, other specified site: Secondary | ICD-10-CM | POA: Diagnosis not present

## 2021-08-31 DIAGNOSIS — R202 Paresthesia of skin: Secondary | ICD-10-CM | POA: Diagnosis not present

## 2021-08-31 LAB — BASIC METABOLIC PANEL
BUN: 13 (ref 4–21)
CO2: 36 — AB (ref 13–22)
Chloride: 99 (ref 99–108)
Creatinine: 0.7 (ref 0.5–1.1)
Glucose: 90
Potassium: 4.1 mEq/L (ref 3.5–5.1)
Sodium: 138 (ref 137–147)

## 2021-08-31 LAB — COMPREHENSIVE METABOLIC PANEL
Albumin: 4.4 (ref 3.5–5.0)
Calcium: 10.2 (ref 8.7–10.7)

## 2021-09-29 DIAGNOSIS — H903 Sensorineural hearing loss, bilateral: Secondary | ICD-10-CM | POA: Diagnosis not present

## 2021-10-23 DIAGNOSIS — S83241A Other tear of medial meniscus, current injury, right knee, initial encounter: Secondary | ICD-10-CM | POA: Diagnosis not present

## 2021-11-27 DIAGNOSIS — L905 Scar conditions and fibrosis of skin: Secondary | ICD-10-CM | POA: Diagnosis not present

## 2021-11-27 DIAGNOSIS — Z85828 Personal history of other malignant neoplasm of skin: Secondary | ICD-10-CM | POA: Diagnosis not present

## 2021-12-08 ENCOUNTER — Encounter: Payer: Self-pay | Admitting: Family Medicine

## 2021-12-08 ENCOUNTER — Ambulatory Visit (INDEPENDENT_AMBULATORY_CARE_PROVIDER_SITE_OTHER): Payer: Medicare Other | Admitting: Family Medicine

## 2021-12-08 VITALS — BP 128/70 | HR 65 | Temp 98.3°F | Ht 65.0 in | Wt 126.4 lb

## 2021-12-08 DIAGNOSIS — M797 Fibromyalgia: Secondary | ICD-10-CM

## 2021-12-08 DIAGNOSIS — I10 Essential (primary) hypertension: Secondary | ICD-10-CM

## 2021-12-08 DIAGNOSIS — F419 Anxiety disorder, unspecified: Secondary | ICD-10-CM

## 2021-12-08 DIAGNOSIS — M47816 Spondylosis without myelopathy or radiculopathy, lumbar region: Secondary | ICD-10-CM | POA: Diagnosis not present

## 2021-12-08 DIAGNOSIS — M47817 Spondylosis without myelopathy or radiculopathy, lumbosacral region: Secondary | ICD-10-CM | POA: Insufficient documentation

## 2021-12-08 DIAGNOSIS — Z8673 Personal history of transient ischemic attack (TIA), and cerebral infarction without residual deficits: Secondary | ICD-10-CM | POA: Diagnosis not present

## 2021-12-08 DIAGNOSIS — R209 Unspecified disturbances of skin sensation: Secondary | ICD-10-CM | POA: Insufficient documentation

## 2021-12-08 DIAGNOSIS — E2839 Other primary ovarian failure: Secondary | ICD-10-CM | POA: Insufficient documentation

## 2021-12-08 DIAGNOSIS — G459 Transient cerebral ischemic attack, unspecified: Secondary | ICD-10-CM

## 2021-12-08 DIAGNOSIS — E7800 Pure hypercholesterolemia, unspecified: Secondary | ICD-10-CM | POA: Diagnosis not present

## 2021-12-08 DIAGNOSIS — Z0000 Encounter for general adult medical examination without abnormal findings: Secondary | ICD-10-CM

## 2021-12-08 DIAGNOSIS — I129 Hypertensive chronic kidney disease with stage 1 through stage 4 chronic kidney disease, or unspecified chronic kidney disease: Secondary | ICD-10-CM | POA: Insufficient documentation

## 2021-12-08 DIAGNOSIS — H9190 Unspecified hearing loss, unspecified ear: Secondary | ICD-10-CM | POA: Insufficient documentation

## 2021-12-08 DIAGNOSIS — M159 Polyosteoarthritis, unspecified: Secondary | ICD-10-CM | POA: Insufficient documentation

## 2021-12-08 DIAGNOSIS — N200 Calculus of kidney: Secondary | ICD-10-CM | POA: Insufficient documentation

## 2021-12-08 DIAGNOSIS — I868 Varicose veins of other specified sites: Secondary | ICD-10-CM

## 2021-12-08 DIAGNOSIS — M8588 Other specified disorders of bone density and structure, other site: Secondary | ICD-10-CM | POA: Insufficient documentation

## 2021-12-08 DIAGNOSIS — M7122 Synovial cyst of popliteal space [Baker], left knee: Secondary | ICD-10-CM

## 2021-12-08 DIAGNOSIS — E041 Nontoxic single thyroid nodule: Secondary | ICD-10-CM | POA: Insufficient documentation

## 2021-12-08 DIAGNOSIS — K5900 Constipation, unspecified: Secondary | ICD-10-CM | POA: Insufficient documentation

## 2021-12-08 DIAGNOSIS — F5101 Primary insomnia: Secondary | ICD-10-CM

## 2021-12-08 HISTORY — DX: Primary insomnia: F51.01

## 2021-12-08 HISTORY — DX: Synovial cyst of popliteal space (Baker), left knee: M71.22

## 2021-12-08 HISTORY — DX: Varicose veins of other specified sites: I86.8

## 2021-12-08 HISTORY — DX: Unspecified hearing loss, unspecified ear: H91.90

## 2021-12-08 MED ORDER — AMLODIPINE BESYLATE 5 MG PO TABS: 5.0000 mg | ORAL_TABLET | Freq: Every day | ORAL | 1 refills | Status: DC

## 2021-12-08 MED ORDER — DULOXETINE HCL 60 MG PO CPEP: 60.0000 mg | ORAL_CAPSULE | Freq: Every day | ORAL | 1 refills | Status: DC

## 2021-12-08 MED ORDER — DICLOFENAC SODIUM 75 MG PO TBEC: 75.0000 mg | DELAYED_RELEASE_TABLET | ORAL | 1 refills | Status: DC | PRN

## 2021-12-08 NOTE — Progress Notes (Signed)
Patient ID: Megan Lester, female  DOB: 1941-01-02, 81 y.o.   MRN: [id number] Patient Care Team    Relationship Specialty Notifications Start End  Ma Hillock, DO PCP - General Family Medicine  12/08/21   Milus Banister, MD Attending Physician Gastroenterology  12/15/21   Warden Fillers, MD Consulting Physician Ophthalmology  12/15/21   Jarome Matin, MD Consulting Physician Dermatology  12/15/21     Chief Complaint  Patient presents with   Establish Care    Subjective:  Megan Lester is a 81 y.o.  female present for new patient establishment. All past medical history, surgical history, allergies, family history, immunizations, medications and social history were updated in the electronic medical record today. All recent labs, ED visits and hospitalizations within the last year were reviewed.   Anxiety Patient reports Cymbalta is very helpful for her anxiety and fibromyalgia.  She is prescribed Xanax 0.5 mg daily as needed.  She reports she rarely uses this medication and does not need any refills today.  Arthropathy of lumbar facet joint/Fibromyalgia Patient reports that Cymbalta is helpful for her fibromyalgia.  She had been tried on Lyrica at 1 time which made her extremely dizzy and nauseous.  Essential hypertension/Hypercholesterolemia/Personal history of transient ischemic attack (TIA), and cerebral infarction without residual deficits Pt reports compliance with amlodipine 5 mg daily. Blood pressures ranges at home within normal limits. Patient denies chest pain, shortness of breath or lower extremity edema. Pt takes a daily baby ASA. Pt is not prescribed statin. RF: Hypertension, TIA.       12/08/2021    1:53 PM  Depression screen PHQ 2/9  Decreased Interest 0  Down, Depressed, Hopeless 0  PHQ - 2 Score 0  Altered sleeping 0  Tired, decreased energy 1  Change in appetite 0  Feeling bad or failure about yourself  0  Trouble concentrating 0  Moving  slowly or fidgety/restless 0  Suicidal thoughts 0  PHQ-9 Score 1      12/08/2021    1:53 PM  GAD 7 : Generalized Anxiety Score  Nervous, Anxious, on Edge 0  Control/stop worrying 0  Worry too much - different things 0  Trouble relaxing 0  Restless 0  Easily annoyed or irritable 0  Afraid - awful might happen 0  Total GAD 7 Score 0              No data to display           Immunization History  Administered Date(s) Administered   Influenza Split 03/07/2013, 03/04/2015, 02/19/2019, 01/18/2021   Influenza, High Dose Seasonal PF 02/12/2014, 02/22/2016, 04/08/2017, 03/20/2018, 03/14/2019   PFIZER(Purple Top)SARS-COV-2 Vaccination 12/15/2019, 01/05/2020   Pneumococcal Conjugate-13 02/12/2014   Pneumococcal Polysaccharide-23 01/14/2007   Td 03/28/2004   Tdap 07/07/2014   Zoster Recombinat (Shingrix) 07/21/2019, 09/19/2019   Zoster, Live 11/20/2005    No results found.  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   Synovial cyst of popliteal space (Baker), left knee 12/08/2021   Thyroid disease    TIA (transient ischemic attack) 02/20/2016   Tubular adenoma    Varicose veins of other specified sites 12/08/2021   Allergies  Allergen Reactions   Lyrica [Pregabalin]     Dizzy and visual changes   Prednisone    Wound Dressing Adhesive     Bandaid adhesive   Past Surgical History:  Procedure Laterality Date    AUGMENTATION MAMMAPLASTY     COLONOSCOPY     DILATION AND CURETTAGE OF UTERUS     EYE SURGERY     both cataracts   INCISION AND DRAINAGE ABSCESS Right 01/27/2013   Procedure: INCISION AND DRAINAGE RIGHT THUMB PULP;  Surgeon: Wynonia Sours, MD;  Location: [facility];  Service: Orthopedics;  Laterality: Right;   study- EMG:  2016   Resulted with radiculopathies but no neuropathy.   VEIN LIGATION     Family History  Adopted: Yes  Problem Relation Age of Onset   Hyperlipidemia Mother    Osteoarthritis Mother    Diabetes Mother    Heart disease Mother    Depression Mother    Osteoarthritis Sister    Stroke Sister    Hypertension Sister    Hyperlipidemia Sister    Diabetes Sister    Osteoarthritis Sister    Heart disease Sister        x 4   Osteoarthritis Sister    Heart disease Sister    Heart disease Sister    Heart disease Sister    Osteoarthritis Brother    Lung cancer Brother        smoker   Heart disease Brother    Hypertension Brother    Hyperlipidemia Brother    Diabetes Brother    Heart disease Brother    Heart attack Brother    Hyperlipidemia Brother    Hypertension Brother    Osteoarthritis Brother    Heart disease Brother        x 3    Spina bifida Daughter    Colon cancer Neg Hx    Social History   Social History Narrative   Marital status/children/pets: widowed (2015)   Education/employment: some college.  Worked 30 yrs  for [employer].   Safety:      -Wears a bicycle helmet riding a bike: Yes     -smoke alarm in the home:Yes     - wears seatbelt: Yes     - Feels safe in their relationships: Yes       Allergies as of 12/08/2021       Reactions   Wound Dressing Adhesive    Bandaid adhesive        Medication List        Accurate as of December 08, 2021 11:59 PM. If you have any questions, ask your nurse or doctor.          STOP taking these medications    acetaminophen 500 MG tablet Commonly known as:  TYLENOL Stopped by: Howard Pouch, DO   cycloSPORINE 0.05 % ophthalmic emulsion Commonly known as: RESTASIS Stopped by: Howard Pouch, DO   diphenhydramine-acetaminophen 25-500 MG Tabs tablet Commonly known as: TYLENOL PM Stopped by: Howard Pouch, DO   GLUCOSAMINE 1500 COMPLEX  PO Stopped by: Howard Pouch, DO   ibuprofen 200 MG tablet Commonly known as: ADVIL Stopped by: Howard Pouch, DO   Magnesium 500 MG Tabs Stopped by: Howard Pouch, DO   meloxicam 15 MG tablet Commonly known as: MOBIC Stopped by: Howard Pouch, DO   methylPREDNISolone 4 MG Tbpk tablet Commonly known as: MEDROL DOSEPAK Stopped by: Howard Pouch, DO   neomycin-polymyxin-hydrocortisone OTIC solution Commonly known as: CORTISPORIN Stopped by: Howard Pouch, DO   traMADol 50 MG tablet Commonly known as: ULTRAM Stopped by: Howard Pouch, DO       TAKE these medications    ALPRAZolam 0.5 MG tablet Commonly known as: XANAX   amLODipine 5 MG tablet Commonly known as: NORVASC Take 1 tablet (5 mg total) by mouth daily.   aspirin 81 MG tablet Take 81 mg by mouth daily.   BIOTIN PO Take 10,000 mcg by mouth.   CALCIUM 600 PO Take by mouth.   cetirizine 5 MG tablet Commonly known as: ZYRTEC Take 5 mg by mouth daily.   diclofenac 75 MG EC tablet Commonly known as: VOLTAREN Take 1 tablet (75 mg total) by mouth as needed.   DULoxetine 60 MG capsule Commonly known as: Cymbalta Take 1 capsule (60 mg total) by mouth daily.   Magnesium 500 MG Caps Take 500 mg by mouth daily.   MULTIVITAMIN PO Take 1 capsule by mouth daily.   polyethylene glycol 0.34 gm/ml Soln Commonly known as: MIRALAX Take 1 g/kg by mouth every 12 (twelve) hours.   PROBIOTIC DAILY PO Take 1 capsule by mouth daily.   triamcinolone cream 0.1 % Commonly known as: KENALOG Apply 1 application. topically 2 (two) times daily.   Turmeric 500 MG Caps Take 1 capsule by mouth daily.        All past medical history, surgical  history, allergies, family history, immunizations andmedications were updated in the EMR today and reviewed under the history and medication portions of their EMR.    No results found for this or any previous visit (from the past 2160 hour(s)).  No results found.   ROS 14 pt review of systems performed and negative (unless mentioned in an HPI)  Objective: BP 128/70   Pulse 65   Temp 98.3 F (36.8 C)   Ht '5\' 5"'$  (1.651 m)   Wt 126 lb 6.4 oz (57.3 kg)   SpO2 96%   BMI 21.03 kg/m  Physical Exam Vitals and nursing note reviewed.  Constitutional:      General: She is not in acute distress.    Appearance: Normal appearance. She is normal weight. She is not ill-appearing or toxic-appearing.  HENT:     Head: Normocephalic and atraumatic.  Eyes:     Extraocular Movements: Extraocular movements intact.     Conjunctiva/sclera: Conjunctivae normal.     Pupils: Pupils are equal, round, and reactive to light.  Cardiovascular:     Rate and Rhythm: Normal rate and regular rhythm.  Pulmonary:     Effort: Pulmonary effort is normal.     Breath sounds: Normal breath sounds.  Musculoskeletal:     Right lower leg: No edema.     Left lower leg: No edema.  Skin:    Findings: No rash.  Neurological:     Mental Status: She is alert and oriented to person, place, and time. Mental status is at baseline.  Psychiatric:        Mood and Affect: Mood normal.        Behavior: Behavior  normal.        Thought Content: Thought content normal.        Judgment: Judgment normal.       Assessment/plan: FLORABELLE CARDIN is a 81 y.o. female present for establish care Anxiety Stable. Continue Cymbalta 60 mg daily.  We will need to monitor kidney function to ensure dose is appropriate at next appointment.  Arthropathy of lumbar facet joint/Fibromyalgia Stable. 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 Continue amlodipine 5 mg daily. Continue ASA 81 Routine exercise and heart healthy diet Reviewed labs from prior PCP.  Return in about 9 months (around 09/03/2022) for cpe (20 min), Routine chronic condition follow-up.  No orders of the defined types were placed in this encounter.  Meds ordered this encounter  Medications   amLODipine (NORVASC) 5 MG tablet    Sig: Take 1 tablet (5 mg total) by mouth daily.    Dispense:  90 tablet    Refill:  1    DC script by other providers. New pcp.   diclofenac (VOLTAREN) 75 MG EC tablet    Sig: Take 1 tablet (75 mg total) by mouth as needed.    Dispense:  90 tablet    Refill:  1    DC prior scripts by other providers. New pcp   DULoxetine (CYMBALTA) 60 MG capsule    Sig: Take 1 capsule (60 mg total) by mouth daily.    Dispense:  90 capsule    Refill:  1    Dc scripts by other providers. New pcp.   Referral Orders  No referral(s) requested today     Note is dictated utilizing voice recognition software. Although note has been proof read prior to signing, occasional typographical errors still can be missed. If any questions arise, please do not hesitate to call for verification.  Electronically signed by: Howard Pouch, DO [place]

## 2021-12-08 NOTE — Patient Instructions (Addendum)
Return in about 9 months (around 09/03/2022) for cpe (20 min), Routine chronic condition follow-up.        Great to see you today.  I have refilled the medication(s) we provide.   If labs were collected, we will inform you of lab results once received either by echart message or telephone call.   - echart message- for normal results that have been seen by the patient already.   - telephone call: abnormal results or if patient has not viewed results in their echart.

## 2021-12-15 ENCOUNTER — Encounter: Payer: Self-pay | Admitting: Family Medicine

## 2021-12-20 ENCOUNTER — Encounter: Payer: Self-pay | Admitting: Gastroenterology

## 2022-01-29 NOTE — Progress Notes (Unsigned)
Megan Lester 81 y.o. 1940/09/05 [id number]  Assessment & Plan:   Encounter Diagnoses  Name Primary?   Dysphagia, unspecified type Yes   Globus sensation    Hoarseness    Hx of adenomatous colonic polyps     Evaluate upper GI symptoms of dysphagia, globus and hoarseness with an EGD.  She could have LPR, but could be esophagitis, esophageal stricture.  The symptoms are a little bit vague but I think this is the next best step.  We reviewed the possibility of barium swallow and elected to go with an EGD.    We reviewed her polyp history and given her age and current expert guidelines regarding repeating colonoscopy in her age group have decided to hold on routine repeat colonoscopy.  The risks and benefits as well as alternatives of endoscopic procedure(s) have been discussed and reviewed. All questions answered. The patient agrees to proceed.   CC: Kuneff, Renee A, DO   Subjective:   Chief Complaint: Dysphagia, discuss colonoscopy  Review of pertinent gastrointestinal problems: 1. Adenomatous polyps: 2002 path report from colonoscopy:  I do not have a copy of the colonoscopy report at the time of this visit however.1. COLON: POLYPOID COLONIC MUCOSA. NO ADENOMATOUS CHANGE ORMALIGNANCY IDENTIFIED.2. COLON, POLYP: TUBULOVILLOUS ADENOMA. NO HIGH GRADE DYSPLASIA OR MALIGNANCY IDENTIFIED.Colonoscopy report [facility] 05/2009: internal hemorrhoids, no polyps,  He recommended repeat colonoscopy in 5 years. Colonsocopy Dr. Ardis Hughs 08/2015: 2 smalls, one was TA; recall at 5 year interval. 2. 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 Last seen by Dr. 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  History of polyps, he had moved recall for routine repeat colonoscopy to 2024 from original 5-year recall in 2022.  She is describing 64-monthhistory of globus sensation and some hoarseness, and says "I burp a lot".  She has some chronic recurrent hiccups which are not a new issue but she mentions them.  She says it is slow for food to pass and there is "pressure with swallowing".  I do not get a history of impact dysphagia.  There is rare heartburn which responds to Tums.  She has some postnasal drip, she has allergy medication taken every day.  These problems are new and have not occurred in the past.  She has never had an upper GI endoscopy.  She does report that her sister had similar symptoms and they were visiting recently and she found out that her sister had an upper GI endoscopy with esophageal dilation.  Wt Readings from Last 3 Encounters:  01/30/22 126 lb (57.2 kg)  12/08/21 126 lb 6.4 oz (57.3 kg)  08/23/21 125 lb (56.7 kg)     She has chronic constipation - unchanged.  There is no rectal bleeding significant abdominal pain.  All other GI review of systems negative.  Very busy with a new 869-monthld goldendoodle puppy. Allergies  Allergen Reactions   Lyrica [Pregabalin]     Dizzy and visual changes   Prednisone    Wound Dressing Adhesive     Bandaid adhesive   Current Meds  Medication Sig   amLODipine (NORVASC) 5 MG tablet Take 1 tablet (5 mg total) by mouth daily.   aspirin 81 MG tablet Take 81 mg by mouth  daily.   BIOTIN PO Take 10,000 mcg by mouth.   Calcium Carbonate (CALCIUM 600 PO) Take by mouth.   cetirizine (ZYRTEC) 5 MG tablet Take 5 mg by mouth daily.   diclofenac (VOLTAREN) 75 MG EC tablet Take 1 tablet (75 mg total) by mouth as needed.   DULoxetine (CYMBALTA) 60 MG capsule Take 1 capsule (60 mg total) by mouth daily.   Magnesium 500 MG CAPS Take 500 mg by mouth daily.   Multiple Vitamins-Minerals (MULTIVITAMIN PO) Take 1 capsule by mouth  daily.   polyethylene glycol (MIRALAX) 0.34 gm/ml SOLN Take 1 g/kg by mouth every 12 (twelve) hours.   Probiotic Product (PROBIOTIC DAILY PO) Take 1 capsule by mouth daily.   Turmeric 500 MG CAPS Take 1 capsule by mouth daily.   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   Synovial cyst of popliteal space (Baker), left knee 12/08/2021   Thyroid disease    TIA (transient ischemic attack) 02/20/2016   Tubular adenoma    Varicose veins of other specified sites 12/08/2021   Past Surgical History:  Procedure Laterality Date   AUGMENTATION MAMMAPLASTY     COLONOSCOPY     DILATION AND CURETTAGE OF UTERUS     EYE SURGERY     both cataracts   INCISION AND DRAINAGE ABSCESS Right 01/27/2013   Procedure: INCISION AND DRAINAGE RIGHT THUMB PULP;  Surgeon: Wynonia Sours, MD;  Location: [facility];  Service: Orthopedics;  Laterality: Right;   study- EMG:  2016   Resulted with radiculopathies but no neuropathy.   VEIN LIGATION     Social History   Social History Narrative   Marital status/children/pets: widowed (2015) 1 son and 3 daughters   Education/employment: some college.  Worked 30 yrs  for [employer].   Safety:      -Wears a bicycle helmet riding a bike: Yes     -smoke alarm in the home:Yes     - wears seatbelt: Yes     - Feels safe in their relationships: Yes   Never smoker no alcohol 2 caffeinated  beverages a day no tobacco or drug use      family history includes Depression in her mother; Diabetes in her brother, mother, and sister; Heart attack in her brother; Heart disease in her brother, brother, brother, mother, sister, sister, sister, and sister; Hyperlipidemia in her brother, brother, mother, and sister; Hypertension in her brother, brother, and sister; Lung cancer in her brother; Osteoarthritis in her brother, brother, mother, sister, sister, and sister; Spina bifida in her daughter; Stroke in her sister. She was adopted.   Review of Systems As per HPI.  She has a new puppy, a golden doodle, very  energetic and reports fatigue related to caring for the puppy.  Some insomnia, she uses hearing aids she recently got new ones and she has back pain and arthritis.  All other review of systems are negative.  Objective:   Physical Exam '@BP'$  132/70   Pulse 62   Ht '5\' 6"'$  (1.676 m)   Wt 126 lb (57.2 kg)   SpO2 96%   BMI 20.34 kg/m @  General:  Well-developed, well-nourished and in no acute distress Eyes:  anicteric. ENT:   Mouth and posterior pharynx free of lesions. + hearing aids Neck:   supple w/o thyromegaly or mass.  Lungs: Clear to auscultation bilaterally. Heart:   S1S2, no rubs, murmurs, gallops. Abdomen:  soft, non-tender, no hepatosplenomegaly, hernia, or mass and BS+.   Lymph:  no cervical or supraclavicular adenopathy. Extremities:   no edema, cyanosis or clubbing Skin   no rash. Neuro:  A&O x 3.  Psych:  appropriate mood and  Affect.   Data Reviewed: See HPI

## 2022-01-30 ENCOUNTER — Ambulatory Visit: Payer: Medicare Other | Admitting: Internal Medicine

## 2022-01-30 ENCOUNTER — Ambulatory Visit: Payer: Medicare Other | Admitting: Gastroenterology

## 2022-01-30 ENCOUNTER — Encounter: Payer: Self-pay | Admitting: Internal Medicine

## 2022-01-30 VITALS — BP 132/70 | HR 62 | Ht 66.0 in | Wt 126.0 lb

## 2022-01-30 DIAGNOSIS — Z86010 Personal history of colonic polyps: Secondary | ICD-10-CM

## 2022-01-30 DIAGNOSIS — R1310 Dysphagia, unspecified: Secondary | ICD-10-CM

## 2022-01-30 DIAGNOSIS — R0989 Other specified symptoms and signs involving the circulatory and respiratory systems: Secondary | ICD-10-CM | POA: Diagnosis not present

## 2022-01-30 DIAGNOSIS — R490 Dysphonia: Secondary | ICD-10-CM

## 2022-01-30 NOTE — Patient Instructions (Signed)
You have been scheduled for an endoscopy. Please follow written instructions given to you at your visit today. If you use inhalers (even only as needed), please bring them with you on the day of your procedure.  Per Dr Carlean Purl we will remove your colonoscopy recall from our system , no more colonoscopy's for you.  I appreciate the opportunity to care for you. Silvano Rusk, MD, [facility]

## 2022-01-31 ENCOUNTER — Ambulatory Visit (INDEPENDENT_AMBULATORY_CARE_PROVIDER_SITE_OTHER): Payer: Medicare Other

## 2022-01-31 DIAGNOSIS — Z0000 Encounter for general adult medical examination without abnormal findings: Secondary | ICD-10-CM

## 2022-01-31 NOTE — Progress Notes (Addendum)
Virtual Visit via Telephone Note  I connected with  Megan Lester on 01/31/22 at  2:00 PM EDT by telephone and verified that I am speaking with the correct person using two identifiers.  Medicare Annual Wellness visit completed telephonically due to Covid-19 pandemic.   Persons participating in this call: This Health Coach and this patient.   Location: Patient: home Provider: office    I discussed the limitations, risks, security and privacy concerns of performing an evaluation and management service by telephone and the availability of in person appointments. The patient expressed understanding and agreed to proceed.  Unable to perform video visit due to video visit attempted and failed and/or patient does not have video capability.   Some vital signs may be absent or patient reported.   Willette Brace, LPN   Subjective:   Megan Lester is a 81 y.o. female who presents for Medicare Annual (Subsequent) preventive examination.  Review of Systems     Cardiac Risk Factors include: advanced age (>86mn, >>85women);hypertension     Objective:    There were no vitals filed for this visit. There is no height or weight on file to calculate BMI.     01/31/2022    2:10 PM 09/12/2015   12:53 PM 01/26/2013    3:08 PM  Advanced Directives  Does Patient Have a Medical Advance Directive? Yes Yes Patient has advance directive, copy not in chart  Type of Advance Directive [place] will   Copy of [place] Chart? No - copy requested      Current Medications (verified) Outpatient Encounter Medications as of 01/31/2022  Medication Sig   amLODipine (NORVASC) 5 MG tablet Take 1 tablet (5 mg total) by mouth daily.   aspirin 81 MG tablet Take 81 mg by mouth daily.   b complex vitamins capsule Take 1 capsule by mouth as needed.   BIOTIN PO Take 10,000 mcg by mouth.   Calcium Carbonate (CALCIUM 600  PO) Take by mouth.   cetirizine (ZYRTEC) 5 MG tablet Take 5 mg by mouth daily.   diclofenac (VOLTAREN) 75 MG EC tablet Take 1 tablet (75 mg total) by mouth as needed.   DULoxetine (CYMBALTA) 60 MG capsule Take 1 capsule (60 mg total) by mouth daily.   Magnesium 500 MG CAPS Take 500 mg by mouth daily.   Multiple Vitamins-Minerals (MULTIVITAMIN PO) Take 1 capsule by mouth daily.   polyethylene glycol (MIRALAX) 0.34 gm/ml SOLN Take 1 g/kg by mouth every 12 (twelve) hours.   Probiotic Product (PROBIOTIC DAILY PO) Take 1 capsule by mouth daily.   Turmeric 500 MG CAPS Take 1 capsule by mouth daily.   No facility-administered encounter medications on file as of 01/31/2022.    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   Colon polyp    Colon polyps    Constipation    DJD (degenerative joint disease)    Fibromyalgia    Hamstring tear    Hearing loss 12/08/2021   Heart murmur    Insomnia    Kidney atrophy    Lumbar facet arthropathy    Medullary sponge kidney  Nephrolithiasis    Neuropathy    Osteopenia of lumbar spine    Paresthesia    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   Synovial cyst of popliteal space (Baker), left knee 12/08/2021   Thyroid disease    TIA (transient ischemic attack) 02/20/2016   Tubular adenoma    Varicose veins of other specified sites 12/08/2021   Past Surgical History:  Procedure Laterality Date   AUGMENTATION MAMMAPLASTY     COLONOSCOPY     DILATION AND CURETTAGE OF UTERUS     EYE SURGERY     both cataracts   INCISION AND DRAINAGE ABSCESS Right 01/27/2013   Procedure: INCISION AND DRAINAGE RIGHT THUMB PULP;  Surgeon: Wynonia Sours, MD;  Location: [facility];  Service: Orthopedics;  Laterality: Right;   study- EMG:  2016   Resulted with radiculopathies but no neuropathy.   VEIN LIGATION     Family History  Adopted: Yes  Problem Relation Age of Onset   Hyperlipidemia Mother    Osteoarthritis Mother    Diabetes Mother    Heart disease Mother    Depression Mother    Osteoarthritis Sister    Stroke Sister    Hypertension Sister    Hyperlipidemia Sister    Diabetes Sister    Osteoarthritis Sister    Heart disease Sister        x 4   Osteoarthritis Sister    Heart disease Sister    Heart disease Sister    Heart disease Sister    Osteoarthritis Brother    Lung cancer Brother        smoker   Heart disease Brother    Hypertension Brother    Hyperlipidemia Brother    Diabetes Brother    Heart disease Brother    Heart attack Brother    Hyperlipidemia Brother    Hypertension Brother    Osteoarthritis Brother    Heart disease Brother        x 3    Spina bifida Daughter    Colon cancer Neg Hx    Stomach cancer Neg Hx    Esophageal cancer Neg Hx    Social History   Socioeconomic History   Marital status: Widowed    Spouse name: Not on file   Number of children: 1   Years of education: Not on file   Highest education level: Not on file  Occupational History   Occupation: retired  Tobacco Use   Smoking status: Never    Passive exposure: Never   Smokeless tobacco: Never  Vaping Use   Vaping Use: Never used  Substance and Sexual Activity   Alcohol use: No    Alcohol/week: 0.0 standard drinks of alcohol   Drug use: No   Sexual activity: Not Currently  Other Topics Concern   Not on file  Social History Narrative   Marital status/children/pets: widowed (2015) 1 son and 3 daughters   Education/employment: some college.  Worked 30 yrs  for [employer].   Safety:      -Wears a bicycle helmet riding a bike: Yes     -smoke alarm in the home:Yes     - wears seatbelt: Yes     - Feels safe in their  relationships: Yes   Never smoker no alcohol 2 caffeinated beverages a day no tobacco or drug use      Social Determinants of Health   Financial Resource Strain:  Low Risk  (01/31/2022)   Overall Financial Resource Strain (CARDIA)    Difficulty of Paying Living Expenses: Not hard at all  Food Insecurity: No Food Insecurity (01/31/2022)   Hunger Vital Sign    Worried About Running Out of Food in the Last Year: Never true    Ran Out of Food in the Last Year: Never true  Transportation Needs: No Transportation Needs (01/31/2022)   PRAPARE - Hydrologist (Medical): No    Lack of Transportation (Non-Medical): No  Physical Activity: Insufficiently Active (01/31/2022)   Exercise Vital Sign    Days of Exercise per Week: 5 days    Minutes of Exercise per Session: 10 min  Stress: No Stress Concern Present (01/31/2022)   [place]    Feeling of Stress : Not at all  Social Connections: Moderately Integrated (01/31/2022)   Social Connection and Isolation Panel [NHANES]    Frequency of Communication with Friends and Family: More than three times a week    Frequency of Social Gatherings with Friends and Family: More than three times a week    Attends Religious Services: More than 4 times per year    Active Member of [employer] or Organizations: Yes    Attends Archivist Meetings: 1 to 4 times per year    Marital Status: Widowed    Tobacco Counseling Counseling given: Not Answered   Clinical Intake:  Pre-visit preparation completed: Yes  Pain : No/denies pain     BMI - recorded: 20.34 Nutritional Status: BMI of 19-24  Normal Nutritional Risks: None Diabetes: No  How often do you need to have someone help you when you read instructions, pamphlets, or other written materials from your doctor or pharmacy?: 1 - Never  Diabetic?no  Interpreter Needed?: No  Information entered by :: Charlott Rakes, LPN   Activities of Daily Living    01/31/2022    2:11 PM  In your present state of health, do you have any difficulty performing the following activities:  Hearing? 1  Comment hearing aids  Vision? 0  Difficulty concentrating or making decisions? 0  Walking or climbing stairs? 0  Dressing or bathing? 0  Doing errands, shopping? 0  Preparing Food and eating ? N  Using the Toilet? N  In the past six months, have you accidently leaked urine? N  Do you have problems with loss of bowel control? N  Managing your Medications? N  Managing your Finances? N  Housekeeping or managing your Housekeeping? N    Patient Care Team: Ma Hillock, DO as PCP - General (Family Medicine) Milus Banister, MD as Attending Physician (Gastroenterology) Warden Fillers, MD as Consulting Physician (Ophthalmology) Jarome Matin, MD as Consulting Physician (Dermatology)  Indicate any recent Medical Services you may have received from other than [facility] providers in the past year (date may be approximate).     Assessment:   This is a routine wellness examination for [place].  Hearing/Vision screen Hearing Screening - Comments:: Pt wears a hearing aid  Vision Screening - Comments:: Pt follows up with Dr Katy Fitch for annual eye exams   Dietary issues and exercise activities discussed: Current Exercise Habits: Home exercise routine, Type of exercise: walking, Time (Minutes): 10, Frequency (Times/Week): 5, Weekly Exercise (Minutes/Week): 50   Goals Addressed             This Visit's Progress    Patient Stated  Maintain health        Depression Screen    01/31/2022    2:07 PM 12/08/2021    1:53 PM  PHQ 2/9 Scores  PHQ - 2 Score 0 0  PHQ- 9 Score 0 1    Fall Risk    01/31/2022    2:11 PM  [place] in the past year? 0  Number falls in past yr: 0  Injury with Fall? 0  Risk for fall due to : No Fall Risks;Impaired vision  Follow up Falls prevention discussed     FALL RISK PREVENTION PERTAINING TO THE HOME:  Any stairs in or around the home? No  If so, are there any without handrails? No  Home free of loose throw rugs in walkways, pet beds, electrical cords, etc? Yes  Adequate lighting in your home to reduce risk of falls? Yes   ASSISTIVE DEVICES UTILIZED TO PREVENT FALLS:  Life alert? No  Use of a cane, walker or w/c? No  Grab bars in the bathroom? Yes  Shower chair or bench in shower? Yes  Elevated toilet seat or a handicapped toilet? Yes   TIMED UP AND GO:  Was the test performed? No .  Cognitive Function:        01/31/2022    2:12 PM  6CIT Screen  What Year? 0 points  What month? 0 points  What time? 0 points  Count back from 20 0 points  Months in reverse 0 points  Repeat phrase 0 points  Total Score 0 points    Immunizations Immunization History  Administered Date(s) Administered   Influenza Split 03/07/2013, 03/04/2015, 02/19/2019, 01/18/2021   Influenza, High Dose Seasonal PF 02/12/2014, 02/22/2016, 04/08/2017, 03/20/2018, 03/14/2019   PFIZER(Purple Top)SARS-COV-2 Vaccination 12/15/2019, 01/05/2020   Pneumococcal Conjugate-13 02/12/2014   Pneumococcal Polysaccharide-23 01/14/2007   Td 03/28/2004   Tdap 07/07/2014   Zoster Recombinat (Shingrix) 07/21/2019, 09/19/2019   Zoster, Live 11/20/2005    TDAP status: Up to date  Flu Vaccine status: Up to date  Pneumococcal vaccine status: Up to date  Covid-19 vaccine status: Completed vaccines  Qualifies for Shingles Vaccine? Yes   Zostavax completed Yes   Shingrix Completed?: Yes  Screening Tests Health Maintenance  Topic Date Due   COVID-19 Vaccine (3 - Pfizer series) 03/01/2020   MAMMOGRAM  05/25/2021   INFLUENZA VACCINE  12/19/2021   COLONOSCOPY (Pts 45-22yr Insurance coverage will need to be confirmed)  09/12/2022   TETANUS/TDAP  07/07/2024   Pneumonia Vaccine 81 Years old  Completed   DEXA SCAN  Completed   Zoster Vaccines- Shingrix  Completed    HPV VACCINES  Aged Out    Health Maintenance  Health Maintenance Due  Topic Date Due   COVID-19 Vaccine (3 - Pfizer series) 03/01/2020   MAMMOGRAM  05/25/2021   INFLUENZA VACCINE  12/19/2021    Colorectal cancer screening: Type of screening: Colonoscopy. Completed 09/12/15. Repeat every 7 years pt stated no longer   Mammogram status: No longer required due to per pt .  Bone Density status: Completed 05/26/19. Results reflect: Bone density results: OSTEOPENIA. Repeat every as directed  years.   Additional Screening:   Vision Screening: Recommended annual ophthalmology exams for early detection of glaucoma and other disorders of the eye. Is the patient up to date with their annual eye exam?  Yes  Who is the provider or what is the name of the office in which the patient attends annual eye exams? Dr gKaty Fitch  If pt is not established with a provider, would they like to be referred to a provider to establish care? No .   Dental Screening: Recommended annual dental exams for proper oral hygiene  Community Resource Referral / Chronic Care Management: CRR required this visit?  No   CCM required this visit?  No      Plan:     I have personally reviewed and noted the following in the patient's chart:   Medical and social history Use of alcohol, tobacco or illicit drugs  Current medications and supplements including opioid prescriptions. Patient is not currently taking opioid prescriptions. Functional ability and status Nutritional status Physical activity Advanced directives List of other physicians Hospitalizations, surgeries, and ER visits in previous 12 months Vitals Screenings to include cognitive, depression, and falls Referrals and appointments  In addition, I have reviewed and discussed with patient certain preventive protocols, quality metrics, and best practice recommendations. A written personalized care plan for preventive services as well as general preventive  health recommendations were provided to patient.     Willette Brace, LPN   09/04/3843   Nurse Notes: none

## 2022-01-31 NOTE — Patient Instructions (Signed)
Megan Lester , Thank you for taking time to come for your Medicare Wellness Visit. I appreciate your ongoing commitment to your health goals. Please review the following plan we discussed and let me know if I can assist you in the future.   Screening recommendations/referrals: Colonoscopy: done 09/12/15 repeat every 7 years  Mammogram: no longer required per pt  Bone Density: done 05/26/19 repeat as directed  Recommended yearly ophthalmology/optometry visit for glaucoma screening and checkup Recommended yearly dental visit for hygiene and checkup  Vaccinations: Influenza vaccine: done 01/18/21 repeat every year  Pneumococcal vaccine: Up to date Tdap vaccine: done 07/07/14 repeat every 10 years  Shingles vaccine: completed 3/2, 09/19/19   Covid-19:completed 7/27, 01/05/20  Advanced directives: Please bring a copy of your health care power of attorney and living will to the office at your convenience.  Conditions/risks identified: maintain health  Next appointment: Follow up in one year for your annual wellness visit    Preventive Care 65 Years and Older, Female Preventive care refers to lifestyle choices and visits with your health care provider that can promote health and wellness. What does preventive care include? A yearly physical exam. This is also called an annual well check. Dental exams once or twice a year. Routine eye exams. Ask your health care provider how often you should have your eyes checked. Personal lifestyle choices, including: Daily care of your teeth and gums. Regular physical activity. Eating a healthy diet. Avoiding tobacco and drug use. Limiting alcohol use. Practicing safe sex. Taking low-dose aspirin every day. Taking vitamin and mineral supplements as recommended by your health care provider. What happens during an annual well check? The services and screenings done by your health care provider during your annual well check will depend on your age, overall  health, lifestyle risk factors, and family history of disease. Counseling  Your health care provider may ask you questions about your: Alcohol use. Tobacco use. Drug use. Emotional well-being. Home and relationship well-being. Sexual activity. Eating habits. History of falls. Memory and ability to understand (cognition). Work and work Statistician. Reproductive health. Screening  You may have the following tests or measurements: Height, weight, and BMI. Blood pressure. Lipid and cholesterol levels. These may be checked every 5 years, or more frequently if you are over 81 years old. Skin check. Lung cancer screening. You may have this screening every year starting at age 11 if you have a 30-pack-year history of smoking and currently smoke or have quit within the past 15 years. Fecal occult blood test (FOBT) of the stool. You may have this test every year starting at age 11. Flexible sigmoidoscopy or colonoscopy. You may have a sigmoidoscopy every 5 years or a colonoscopy every 10 years starting at age 72. Hepatitis C blood test. Hepatitis B blood test. Sexually transmitted disease (STD) testing. Diabetes screening. This is done by checking your blood sugar (glucose) after you have not eaten for a while (fasting). You may have this done every 1-3 years. Bone density scan. This is done to screen for osteoporosis. You may have this done starting at age 60. Mammogram. This may be done every 1-2 years. Talk to your health care provider about how often you should have regular mammograms. Talk with your health care provider about your test results, treatment options, and if necessary, the need for more tests. Vaccines  Your health care provider may recommend certain vaccines, such as: Influenza vaccine. This is recommended every year. Tetanus, diphtheria, and acellular pertussis (Tdap, Td) vaccine.  You may need a Td booster every 10 years. Zoster vaccine. You may need this after age  9. Pneumococcal 13-valent conjugate (PCV13) vaccine. One dose is recommended after age 39. Pneumococcal polysaccharide (PPSV23) vaccine. One dose is recommended after age 40. Talk to your health care provider about which screenings and vaccines you need and how often you need them. This information is not intended to replace advice given to you by your health care provider. Make sure you discuss any questions you have with your health care provider. Document Released: 06/03/2015 Document Revised: 01/25/2016 Document Reviewed: 03/08/2015 Elsevier Interactive Patient Education  2017 [place] Prevention in the Home Falls can cause injuries. They can happen to people of all ages. There are many things you can do to make your home safe and to help prevent falls. What can I do on the outside of my home? Regularly fix the edges of walkways and driveways and fix any cracks. Remove anything that might make you trip as you walk through a door, such as a raised step or threshold. Trim any bushes or trees on the path to your home. Use bright outdoor lighting. Clear any walking paths of anything that might make someone trip, such as rocks or tools. Regularly check to see if handrails are loose or broken. Make sure that both sides of any steps have handrails. Any raised decks and porches should have guardrails on the edges. Have any leaves, snow, or ice cleared regularly. Use sand or salt on walking paths during winter. Clean up any spills in your garage right away. This includes oil or grease spills. What can I do in the bathroom? Use night lights. Install grab bars by the toilet and in the tub and shower. Do not use towel bars as grab bars. Use non-skid mats or decals in the tub or shower. If you need to sit down in the shower, use a plastic, non-slip stool. Keep the floor dry. Clean up any water that spills on the floor as soon as it happens. Remove soap buildup in the tub or shower  regularly. Attach bath mats securely with double-sided non-slip rug tape. Do not have throw rugs and other things on the floor that can make you trip. What can I do in the bedroom? Use night lights. Make sure that you have a light by your bed that is easy to reach. Do not use any sheets or blankets that are too big for your bed. They should not hang down onto the floor. Have a firm chair that has side arms. You can use this for support while you get dressed. Do not have throw rugs and other things on the floor that can make you trip. What can I do in the kitchen? Clean up any spills right away. Avoid walking on wet floors. Keep items that you use a lot in easy-to-reach places. If you need to reach something above you, use a strong step stool that has a grab bar. Keep electrical cords out of the way. Do not use floor polish or wax that makes floors slippery. If you must use wax, use non-skid floor wax. Do not have throw rugs and other things on the floor that can make you trip. What can I do with my stairs? Do not leave any items on the stairs. Make sure that there are handrails on both sides of the stairs and use them. Fix handrails that are broken or loose. Make sure that handrails are as long as  the stairways. Check any carpeting to make sure that it is firmly attached to the stairs. Fix any carpet that is loose or worn. Avoid having throw rugs at the top or bottom of the stairs. If you do have throw rugs, attach them to the floor with carpet tape. Make sure that you have a light switch at the top of the stairs and the bottom of the stairs. If you do not have them, ask someone to add them for you. What else can I do to help prevent falls? Wear shoes that: Do not have high heels. Have rubber bottoms. Are comfortable and fit you well. Are closed at the toe. Do not wear sandals. If you use a stepladder: Make sure that it is fully opened. Do not climb a closed stepladder. Make sure that  both sides of the stepladder are locked into place. Ask someone to hold it for you, if possible. Clearly mark and make sure that you can see: Any grab bars or handrails. First and last steps. Where the edge of each step is. Use tools that help you move around (mobility aids) if they are needed. These include: Canes. Walkers. Scooters. Crutches. Turn on the lights when you go into a dark area. Replace any light bulbs as soon as they burn out. Set up your furniture so you have a clear path. Avoid moving your furniture around. If any of your floors are uneven, fix them. If there are any pets around you, be aware of where they are. Review your medicines with your doctor. Some medicines can make you feel dizzy. This can increase your chance of falling. Ask your doctor what other things that you can do to help prevent falls. This information is not intended to replace advice given to you by your health care provider. Make sure you discuss any questions you have with your health care provider. Document Released: 03/03/2009 Document Revised: 10/13/2015 Document Reviewed: 06/11/2014 Elsevier Interactive Patient Education  2017 [employer].

## 2022-03-06 ENCOUNTER — Encounter: Payer: Self-pay | Admitting: Internal Medicine

## 2022-03-06 ENCOUNTER — Ambulatory Visit (AMBULATORY_SURGERY_CENTER): Payer: Medicare Other | Admitting: Internal Medicine

## 2022-03-06 VITALS — BP 169/69 | HR 62 | Temp 96.7°F | Resp 18 | Ht 66.0 in | Wt 126.0 lb

## 2022-03-06 DIAGNOSIS — K2970 Gastritis, unspecified, without bleeding: Secondary | ICD-10-CM | POA: Diagnosis not present

## 2022-03-06 DIAGNOSIS — B9681 Helicobacter pylori [H. pylori] as the cause of diseases classified elsewhere: Secondary | ICD-10-CM | POA: Diagnosis not present

## 2022-03-06 DIAGNOSIS — R1310 Dysphagia, unspecified: Secondary | ICD-10-CM | POA: Diagnosis not present

## 2022-03-06 DIAGNOSIS — K2950 Unspecified chronic gastritis without bleeding: Secondary | ICD-10-CM | POA: Diagnosis not present

## 2022-03-06 MED ORDER — SODIUM CHLORIDE 0.9 % IV SOLN: 500.0000 mL | Freq: Once | INTRAVENOUS | Status: DC

## 2022-03-06 NOTE — Patient Instructions (Addendum)
I dilated the esophagus to see if that helps you. It looked normal overall. No strictures or other problems seen there.  The stomach was mildly inflamed - we call that gastritis - biopsies were taken.   I appreciate the opportunity to care for you. Gatha Mayer, MD, FACG  FOLLOW DILATATION DIET GIVEN TO YOU TODAY ,BACK TO USUAL DIET TOMORROW AS TOLERATED    Handout on gastritis given to you today  Await pathology results of biopsies done     YOU HAD AN ENDOSCOPIC PROCEDURE TODAY AT [place] [facility]:   Refer to the procedure report that was given to you for any specific questions about what was found during the examination.  If the procedure report does not answer your questions, please call your gastroenterologist to clarify.  If you requested that your care partner not be given the details of your procedure findings, then the procedure report has been included in a sealed envelope for you to review at your convenience later.  YOU SHOULD EXPECT: Some feelings of bloating in the abdomen. Passage of more gas than usual.  Walking can help get rid of the air that was put into your GI tract during the procedure and reduce the bloating. If you had a lower endoscopy (such as a colonoscopy or flexible sigmoidoscopy) you may notice spotting of blood in your stool or on the toilet paper. If you underwent a bowel prep for your procedure, you may not have a normal bowel movement for a few days.  Please Note:  You might notice some irritation and congestion in your nose or some drainage.  This is from the oxygen used during your procedure.  There is no need for concern and it should clear up in a day or so.  SYMPTOMS TO REPORT IMMEDIATELY:  Following upper endoscopy (EGD)  Vomiting of blood or coffee ground material  New chest pain or pain under the shoulder blades  Painful or persistently difficult swallowing  New shortness of breath  Fever of 100F or higher  Black, tarry-looking  stools  For urgent or emergent issues, a gastroenterologist can be reached at any hour by calling [phone]. Do not use MyChart messaging for urgent concerns.    DIET:  We do recommend a small meal at first, but then you may proceed to your regular diet.  Drink plenty of fluids but you should avoid alcoholic beverages for 24 hours.  ACTIVITY:  You should plan to take it easy for the rest of today and you should NOT DRIVE or use heavy machinery until tomorrow (because of the sedation medicines used during the test).    FOLLOW UP: Our staff will call the number listed on your records the next business day following your procedure.  We will call around 7:15- 8:00 am to check on you and address any questions or concerns that you may have regarding the information given to you following your procedure. If we do not reach you, we will leave a message.     If any biopsies were taken you will be contacted by phone or by letter within the next 1-3 weeks.  Please call us at [phone] if you have not heard about the biopsies in 3 weeks.    SIGNATURES/CONFIDENTIALITY: You and/or your care partner have signed paperwork which will be entered into your electronic medical record.  These signatures attest to the fact that that the information above on your After Visit Summary has been reviewed and is  understood.  Full responsibility of the confidentiality of this discharge information lies with you and/or your care-partner.

## 2022-03-06 NOTE — Progress Notes (Signed)
Called to room to assist during endoscopic procedure.  Patient ID and intended procedure confirmed with present staff. Received instructions for my participation in the procedure from the performing physician.  

## 2022-03-06 NOTE — Progress Notes (Signed)
[facility] History and Physical   Primary Care Physician:  Ma Hillock, DO   Reason for Procedure:   Dysphagia + globus  Plan:    EGD, possible esophageal dilation     HPI: CONTINA STRAIN is a 81 y.o. female history of colon polyps as above here with complaints of dysphagia symptoms and asking about scheduling a surveillance colonoscopy. Last seen by Dr. 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  History of polyps, he had moved recall for routine repeat colonoscopy to 2024 from original 5-year recall in 2022.   She is describing 63-monthhistory of globus sensation and some hoarseness, and says "I burp a lot".  She has some chronic recurrent hiccups which are not a new issue but she mentions them.  She says it is slow for food to pass and there is "pressure with swallowing".  I do not get a history of impact dysphagia.  There is rare heartburn which responds to Tums.  She has some postnasal drip, she has allergy medication taken every day.  These problems are new and have not occurred in the past.  She has never had an upper GI endoscopy.  She does report that her sister had similar symptoms and they were visiting recently and she found out that her sister had an upper GI endoscopy with esophageal dilation.    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   Synovial cyst of popliteal space (Baker), left knee 12/08/2021   Thyroid disease    TIA (transient ischemic attack) 02/20/2016   Tubular adenoma    Varicose veins of other specified sites 12/08/2021    Past Surgical History:  Procedure Laterality Date   AUGMENTATION MAMMAPLASTY     COLONOSCOPY     DILATION AND CURETTAGE OF UTERUS     EYE SURGERY     both cataracts   INCISION AND DRAINAGE ABSCESS Right 01/27/2013   Procedure: INCISION AND DRAINAGE RIGHT THUMB PULP;  Surgeon: GWynonia Sours MD;  Location: [facility]  Service: Orthopedics;  Laterality: Right;   study- EMG:  2016   Resulted with radiculopathies but no neuropathy.   VEIN LIGATION      Prior to Admission medications   Medication Sig Start Date End Date Taking? Authorizing Provider  amLODipine (NORVASC) 5 MG tablet Take 1 tablet (5 mg total) by mouth daily. 12/08/21  Yes Kuneff, Renee A, DO  aspirin 81 MG tablet Take 81 mg by mouth daily.   Yes [provider]  b complex vitamins capsule Take 1 capsule by mouth as needed.   Yes [provider]  BIOTIN PO Take 10,000 mcg by  mouth.   Yes [provider]  Calcium Carbonate (CALCIUM 600 PO) Take by mouth.   Yes [provider]  cetirizine (ZYRTEC) 5 MG tablet Take 5 mg by mouth daily.   Yes [provider]  DULoxetine (CYMBALTA) 60 MG capsule Take 1 capsule (60 mg total) by mouth daily. 12/08/21  Yes Kuneff, Renee A, DO  Magnesium 500 MG CAPS Take 500 mg by mouth daily.   Yes [provider]  Multiple Vitamins-Minerals (MULTIVITAMIN PO) Take 1 capsule by mouth daily.   Yes [provider]  polyethylene glycol (MIRALAX) 0.34 gm/ml SOLN Take 1 g/kg by mouth every 12 (twelve) hours.   Yes [provider]  Probiotic Product (PROBIOTIC DAILY PO) Take 1 capsule by mouth daily.   Yes [provider]  Turmeric 500 MG CAPS Take 1 capsule by mouth daily.   Yes [provider]  diclofenac (VOLTAREN) 75 MG EC tablet Take 1 tablet (75 mg total) by mouth as needed. 12/08/21   Howard Pouch A, DO    Current Outpatient Medications  Medication Sig Dispense Refill   amLODipine (NORVASC) 5 MG tablet Take 1 tablet (5 mg total) by mouth daily. 90 tablet 1   aspirin 81 MG tablet Take 81 mg by mouth daily.     b complex vitamins capsule Take 1 capsule by mouth as needed.     BIOTIN PO Take 10,000 mcg by mouth.     Calcium Carbonate (CALCIUM 600 PO) Take by mouth.     cetirizine (ZYRTEC) 5 MG tablet Take 5 mg by mouth daily.     DULoxetine (CYMBALTA) 60 MG capsule Take 1 capsule (60 mg total) by mouth daily. 90 capsule 1   Magnesium 500 MG CAPS Take 500 mg by mouth daily.     Multiple Vitamins-Minerals (MULTIVITAMIN PO) Take 1 capsule by mouth daily.     polyethylene glycol (MIRALAX) 0.34 gm/ml SOLN Take 1 g/kg by mouth every 12 (twelve) hours.     Probiotic Product (PROBIOTIC DAILY PO) Take 1 capsule by mouth daily.     Turmeric 500 MG CAPS Take 1 capsule by mouth daily.     diclofenac (VOLTAREN) 75 MG EC tablet Take 1 tablet (75 mg total) by mouth as needed. 90 tablet 1   Current Facility-Administered Medications  Medication Dose Route Frequency Provider Last Rate Last Admin   0.9 %  sodium chloride infusion  500 mL Intravenous Once Gatha Mayer, MD        Allergies as of 03/06/2022 - Review Complete 03/06/2022  Allergen Reaction Noted   Lyrica [pregabalin]  12/15/2021   Prednisone  12/15/2021   Wound dressing adhesive  08/23/2021    Family History  Adopted: Yes  Problem Relation Age of Onset   Hyperlipidemia Mother    Osteoarthritis Mother    Diabetes Mother    Heart disease Mother    Depression Mother    Osteoarthritis Sister    Stroke Sister    Hypertension Sister    Hyperlipidemia Sister    Diabetes Sister    Osteoarthritis Sister    Heart disease Sister        x 4   Osteoarthritis Sister    Heart disease Sister    Heart disease  Sister    Heart disease Sister    Osteoarthritis Brother    Lung cancer Brother        smoker   Heart disease Brother    Hypertension Brother    Hyperlipidemia  Brother    Diabetes Brother    Heart disease Brother    Heart attack Brother    Hyperlipidemia Brother    Hypertension Brother    Osteoarthritis Brother    Heart disease Brother        x 3    Spina bifida Daughter    Colon cancer Neg Hx    Stomach cancer Neg Hx    Esophageal cancer Neg Hx     Social History   Socioeconomic History   Marital status: Widowed    Spouse name: Not on file   Number of children: 1   Years of education: Not on file   Highest education level: Not on file  Occupational History   Occupation: retired  Tobacco Use   Smoking status: Never    Passive exposure: Never   Smokeless tobacco: Never  Vaping Use   Vaping Use: Never used  Substance and Sexual Activity   Alcohol use: No    Alcohol/week: 0.0 standard drinks of alcohol   Drug use: No   Sexual activity: Not Currently  Other Topics Concern   Not on file  Social History Narrative   Marital status/children/pets: widowed (2015) 1 son and 3 daughters   Education/employment: some college.  Worked 30 yrs  for [employer].   Safety:      -Wears a bicycle helmet riding a bike: Yes     -smoke alarm in the home:Yes     - wears seatbelt: Yes     - Feels safe in their relationships: Yes   Never smoker no alcohol 2 caffeinated beverages a day no tobacco or drug use      Social Determinants of Health   Financial Resource Strain: Low Risk  (01/31/2022)   Overall Financial Resource Strain (CARDIA)    Difficulty of Paying Living Expenses: Not hard at all  Food Insecurity: No Food Insecurity (01/31/2022)   Hunger Vital Sign    Worried About Running Out of Food in the Last Year: Never true    [place] in the Last Year: Never true  Transportation Needs: No Transportation Needs (01/31/2022)   PRAPARE - Radiographer, therapeutic (Medical): No    Lack of Transportation (Non-Medical): No  Physical Activity: Insufficiently Active (01/31/2022)   Exercise Vital Sign    Days of Exercise per Week: 5 days    Minutes of Exercise per Session: 10 min  Stress: No Stress Concern Present (01/31/2022)   [place]    Feeling of Stress : Not at all  Social Connections: Moderately Integrated (01/31/2022)   Social Connection and Isolation Panel [NHANES]    Frequency of Communication with Friends and Family: More than three times a week    Frequency of Social Gatherings with Friends and Family: More than three times a week    Attends Religious Services: More than 4 times per year    Active Member of [employer] or Organizations: Yes    Attends Archivist Meetings: 1 to 4 times per year    Marital Status: Widowed  Intimate Partner Violence: Not At Risk (01/31/2022)   Humiliation, Afraid, Rape, and Kick questionnaire    Fear of Current or Ex-Partner: No    Emotionally Abused: No    Physically Abused: No    Sexually Abused: No    Review of Systems:  All other review of systems negative except as mentioned in the HPI.  Physical  Exam: Vital signs BP (!) 174/87   Pulse (!) 58   Temp (!) 96.7 F (35.9 C) (Temporal)   Ht '5\' 6"'$  (1.676 m)   Wt 126 lb (57.2 kg)   SpO2 100%   BMI 20.34 kg/m   General:   Alert,  Well-developed, well-nourished, pleasant and cooperative in NAD Lungs:  Clear throughout to auscultation.   Heart:  Regular rate and rhythm; no murmurs, clicks, rubs,  or gallops. Abdomen:  Soft, nontender and nondistended. Normal bowel sounds.   Neuro/Psych:  Alert and cooperative. Normal mood and affect. A and O x 3   '@Jaishon Krisher'$  Simonne Maffucci, MD, [facility] [phone] (pager) 03/06/2022 3:47 PM@

## 2022-03-06 NOTE — Op Note (Addendum)
[facility] Patient Name: Megan Lester Procedure Date: 03/06/2022 3:38 PM MRN: [id number] Endoscopist: Gatha Mayer , MD Age: 81 Referring MD:  Date of Birth: 1941-02-27 Gender: Female Account #: [id number] Procedure:                Upper GI endoscopy Indications:              Dysphagia, Globus sensation Medicines:                Monitored Anesthesia Care Procedure:                Pre-Anesthesia Assessment:                           - Prior to the procedure, a History and Physical                            was performed, and patient medications and                            allergies were reviewed. The patient's tolerance of                            previous anesthesia was also reviewed. The risks                            and benefits of the procedure and the sedation                            options and risks were discussed with the patient.                            All questions were answered, and informed consent                            was obtained. Prior Anticoagulants: The patient has                            taken no previous anticoagulant or antiplatelet                            agents. ASA Grade Assessment: III - A patient with                            severe systemic disease. After reviewing the risks                            and benefits, the patient was deemed in                            satisfactory condition to undergo the procedure.                           After obtaining informed consent, the endoscope was  passed under direct vision. Throughout the                            procedure, the patient's blood pressure, pulse, and                            oxygen saturations were monitored continuously. The                            Endoscope was introduced through the mouth, and                            advanced to the second part of duodenum. The upper                            GI endoscopy was  accomplished without difficulty.                            The patient tolerated the procedure well. Scope In: Scope Out: Findings:                 No endoscopic abnormality was evident in the                            esophagus to explain the patient's complaint of                            dysphagia. It was decided, however, to proceed with                            dilation of the entire esophagus. The scope was                            withdrawn. Dilation was performed with a Maloney                            dilator with no resistance at 33 Fr. The dilation                            site was examined following endoscope reinsertion                            and showed no change. Estimated blood loss: none.                           The Z-line was irregular and was found at the                            gastroesophageal junction.                           Diffuse mild inflammation characterized by erosions  and erythema was found in the gastric fundus, in                            the gastric body and in the gastric antrum.                            Biopsies were taken with a cold forceps for                            histology. Verification of patient identification                            for the specimen was done. Estimated blood loss was                            minimal.                           The cardia and gastric fundus were normal on                            retroflexion.                           The exam was otherwise without abnormality. Complications:            No immediate complications. Estimated Blood Loss:     Estimated blood loss was minimal. Impression:               - No endoscopic esophageal abnormality to explain                            patient's dysphagia. Esophagus dilated. Dilated.                           - Z-line irregular, at the gastroesophageal                            junction.                            - Gastritis. Biopsied.                           - The examination was otherwise normal. Recommendation:           - Patient has a contact number available for                            emergencies. The signs and symptoms of potential                            delayed complications were discussed with the                            patient. Return to normal activities tomorrow.  Written discharge instructions were provided to the                            patient.                           - Clear liquids x 1 hour then soft foods rest of                            day. Start prior diet tomorrow.                           - Await pathology results.                           - Continue present medications. Is taking 81 mg ASA                            and diclofenac (knee) daily Gatha Mayer, MD 03/06/2022 4:08:34 PM This report has been signed electronically.

## 2022-03-06 NOTE — Progress Notes (Signed)
Pt resting comfortably. VSS. Airway intact. SBAR complete to RN. All questions answered.   

## 2022-03-06 NOTE — Progress Notes (Signed)
Pt's states no medical or surgical changes since previsit or office visit. 

## 2022-03-07 ENCOUNTER — Telehealth: Payer: Self-pay | Admitting: *Deleted

## 2022-03-07 NOTE — Telephone Encounter (Signed)
Left message on f/u call 

## 2022-03-12 DIAGNOSIS — S83241A Other tear of medial meniscus, current injury, right knee, initial encounter: Secondary | ICD-10-CM | POA: Diagnosis not present

## 2022-03-13 ENCOUNTER — Encounter: Payer: Self-pay | Admitting: Internal Medicine

## 2022-03-13 DIAGNOSIS — B9681 Helicobacter pylori [H. pylori] as the cause of diseases classified elsewhere: Secondary | ICD-10-CM

## 2022-03-13 DIAGNOSIS — M25561 Pain in right knee: Secondary | ICD-10-CM | POA: Diagnosis not present

## 2022-03-13 HISTORY — DX: Gastritis, unspecified, without bleeding: B96.81

## 2022-03-15 ENCOUNTER — Other Ambulatory Visit: Payer: Self-pay

## 2022-03-15 DIAGNOSIS — B9681 Helicobacter pylori [H. pylori] as the cause of diseases classified elsewhere: Secondary | ICD-10-CM

## 2022-03-15 MED ORDER — DOXYCYCLINE HYCLATE 50 MG PO CAPS: 100.0000 mg | ORAL_CAPSULE | Freq: Two times a day (BID) | ORAL | 0 refills | Status: AC

## 2022-03-15 MED ORDER — BISMUTH SUBSALICYLATE 262 MG PO CHEW: 524.0000 mg | CHEWABLE_TABLET | Freq: Four times a day (QID) | ORAL | 0 refills | Status: AC

## 2022-03-15 MED ORDER — METRONIDAZOLE 250 MG PO TABS: 250.0000 mg | ORAL_TABLET | Freq: Four times a day (QID) | ORAL | 0 refills | Status: AC

## 2022-03-15 MED ORDER — OMEPRAZOLE 20 MG PO CPDR: 20.0000 mg | DELAYED_RELEASE_CAPSULE | Freq: Two times a day (BID) | ORAL | 0 refills | Status: DC

## 2022-03-21 ENCOUNTER — Telehealth: Payer: Self-pay | Admitting: Internal Medicine

## 2022-03-21 NOTE — Telephone Encounter (Signed)
Returned call to patient. She states that she is on antibiotics treatment for H. Pylori and stool is "black as coal". I told pt that her stools are likely black due to the Bismuth. I told pt that stools should return to usual color once she completes the treatment. Pt verbalized understanding and had no concerns at the end of the call.

## 2022-03-21 NOTE — Telephone Encounter (Signed)
Patient called states she has few questions regarding her medications. Requesting a call back. Please call to advise.

## 2022-04-03 DIAGNOSIS — M7121 Synovial cyst of popliteal space [Baker], right knee: Secondary | ICD-10-CM | POA: Diagnosis not present

## 2022-04-20 ENCOUNTER — Telehealth: Payer: Self-pay | Admitting: Internal Medicine

## 2022-04-20 NOTE — Telephone Encounter (Signed)
Patient is calling wondering if it's okay for her to get her labs done a few days sooner than the one month date provided to her. Please advise

## 2022-04-23 DIAGNOSIS — Z85828 Personal history of other malignant neoplasm of skin: Secondary | ICD-10-CM | POA: Diagnosis not present

## 2022-04-23 DIAGNOSIS — L570 Actinic keratosis: Secondary | ICD-10-CM | POA: Diagnosis not present

## 2022-04-23 DIAGNOSIS — D1801 Hemangioma of skin and subcutaneous tissue: Secondary | ICD-10-CM | POA: Diagnosis not present

## 2022-04-23 DIAGNOSIS — L821 Other seborrheic keratosis: Secondary | ICD-10-CM | POA: Diagnosis not present

## 2022-04-23 NOTE — Telephone Encounter (Signed)
Left message for pt to call back  °

## 2022-04-23 NOTE — Telephone Encounter (Signed)
Pt questioned if she could come 2 days earlier to obtain her stool specimen kit for the H pylori: Pt was notified that she could come two days earlier: Pt verbalized understanding with all questions answered.

## 2022-04-26 ENCOUNTER — Other Ambulatory Visit: Payer: Medicare Other

## 2022-04-30 ENCOUNTER — Other Ambulatory Visit: Payer: Medicare Other

## 2022-04-30 DIAGNOSIS — K2970 Gastritis, unspecified, without bleeding: Secondary | ICD-10-CM | POA: Diagnosis not present

## 2022-04-30 DIAGNOSIS — B9681 Helicobacter pylori [H. pylori] as the cause of diseases classified elsewhere: Secondary | ICD-10-CM

## 2022-05-02 LAB — H. PYLORI ANTIGEN, STOOL: H pylori Ag, Stl: NEGATIVE

## 2022-06-12 ENCOUNTER — Other Ambulatory Visit: Payer: Self-pay

## 2022-06-12 MED ORDER — DICLOFENAC SODIUM 75 MG PO TBEC: 75.0000 mg | DELAYED_RELEASE_TABLET | ORAL | 0 refills | Status: AC | PRN

## 2022-06-15 ENCOUNTER — Other Ambulatory Visit: Payer: Self-pay

## 2022-06-20 DIAGNOSIS — M545 Low back pain, unspecified: Secondary | ICD-10-CM | POA: Diagnosis not present

## 2022-06-20 DIAGNOSIS — Z0189 Encounter for other specified special examinations: Secondary | ICD-10-CM | POA: Diagnosis not present

## 2022-06-20 DIAGNOSIS — M797 Fibromyalgia: Secondary | ICD-10-CM | POA: Diagnosis not present

## 2022-06-20 DIAGNOSIS — Z139 Encounter for screening, unspecified: Secondary | ICD-10-CM | POA: Diagnosis not present

## 2022-06-20 DIAGNOSIS — K2970 Gastritis, unspecified, without bleeding: Secondary | ICD-10-CM | POA: Diagnosis not present

## 2022-06-20 DIAGNOSIS — F32A Depression, unspecified: Secondary | ICD-10-CM | POA: Diagnosis not present

## 2022-06-20 DIAGNOSIS — I10 Essential (primary) hypertension: Secondary | ICD-10-CM | POA: Diagnosis not present

## 2022-06-20 DIAGNOSIS — G8929 Other chronic pain: Secondary | ICD-10-CM | POA: Diagnosis not present

## 2022-07-13 DIAGNOSIS — Z139 Encounter for screening, unspecified: Secondary | ICD-10-CM | POA: Diagnosis not present

## 2022-07-18 ENCOUNTER — Other Ambulatory Visit (HOSPITAL_COMMUNITY): Payer: Self-pay | Admitting: Internal Medicine

## 2022-07-18 DIAGNOSIS — M797 Fibromyalgia: Secondary | ICD-10-CM | POA: Diagnosis not present

## 2022-07-18 DIAGNOSIS — M8580 Other specified disorders of bone density and structure, unspecified site: Secondary | ICD-10-CM | POA: Diagnosis not present

## 2022-07-18 DIAGNOSIS — G8929 Other chronic pain: Secondary | ICD-10-CM | POA: Diagnosis not present

## 2022-07-18 DIAGNOSIS — K2970 Gastritis, unspecified, without bleeding: Secondary | ICD-10-CM | POA: Diagnosis not present

## 2022-07-18 DIAGNOSIS — F32A Depression, unspecified: Secondary | ICD-10-CM | POA: Diagnosis not present

## 2022-07-18 DIAGNOSIS — Z1231 Encounter for screening mammogram for malignant neoplasm of breast: Secondary | ICD-10-CM

## 2022-07-18 DIAGNOSIS — R7303 Prediabetes: Secondary | ICD-10-CM | POA: Diagnosis not present

## 2022-07-18 DIAGNOSIS — I10 Essential (primary) hypertension: Secondary | ICD-10-CM | POA: Diagnosis not present

## 2022-07-18 DIAGNOSIS — Z8673 Personal history of transient ischemic attack (TIA), and cerebral infarction without residual deficits: Secondary | ICD-10-CM | POA: Diagnosis not present

## 2022-07-18 DIAGNOSIS — Z1239 Encounter for other screening for malignant neoplasm of breast: Secondary | ICD-10-CM | POA: Diagnosis not present

## 2022-07-18 DIAGNOSIS — E782 Mixed hyperlipidemia: Secondary | ICD-10-CM | POA: Diagnosis not present

## 2022-07-18 DIAGNOSIS — M545 Low back pain, unspecified: Secondary | ICD-10-CM | POA: Diagnosis not present

## 2022-07-23 DIAGNOSIS — M1711 Unilateral primary osteoarthritis, right knee: Secondary | ICD-10-CM | POA: Diagnosis not present

## 2022-07-27 ENCOUNTER — Ambulatory Visit (HOSPITAL_COMMUNITY): Payer: Medicare Other

## 2022-07-30 ENCOUNTER — Ambulatory Visit (HOSPITAL_COMMUNITY)
Admission: RE | Admit: 2022-07-30 | Discharge: 2022-07-30 | Disposition: A | Discharge status: Home / Self Care | Payer: Medicare Other | Source: Ambulatory Visit | Attending: Internal Medicine | Admitting: Internal Medicine

## 2022-07-30 ENCOUNTER — Encounter (HOSPITAL_COMMUNITY): Payer: Self-pay

## 2022-07-30 DIAGNOSIS — Z780 Asymptomatic menopausal state: Secondary | ICD-10-CM | POA: Insufficient documentation

## 2022-07-30 DIAGNOSIS — Z1231 Encounter for screening mammogram for malignant neoplasm of breast: Secondary | ICD-10-CM | POA: Insufficient documentation

## 2022-07-30 DIAGNOSIS — Z13820 Encounter for screening for osteoporosis: Secondary | ICD-10-CM | POA: Diagnosis not present

## 2022-07-30 DIAGNOSIS — M810 Age-related osteoporosis without current pathological fracture: Secondary | ICD-10-CM | POA: Diagnosis not present

## 2022-07-30 DIAGNOSIS — M8580 Other specified disorders of bone density and structure, unspecified site: Secondary | ICD-10-CM

## 2022-08-28 ENCOUNTER — Encounter: Payer: Medicare Other | Admitting: Family Medicine

## 2022-09-26 DIAGNOSIS — M1711 Unilateral primary osteoarthritis, right knee: Secondary | ICD-10-CM | POA: Diagnosis not present

## 2022-10-08 ENCOUNTER — Encounter: Payer: Self-pay | Admitting: Gastroenterology

## 2022-10-11 ENCOUNTER — Other Ambulatory Visit (HOSPITAL_COMMUNITY): Payer: Self-pay | Admitting: Internal Medicine

## 2022-10-11 DIAGNOSIS — Z8673 Personal history of transient ischemic attack (TIA), and cerebral infarction without residual deficits: Secondary | ICD-10-CM | POA: Diagnosis not present

## 2022-10-11 DIAGNOSIS — Z79899 Other long term (current) drug therapy: Secondary | ICD-10-CM | POA: Diagnosis not present

## 2022-10-11 DIAGNOSIS — H00014 Hordeolum externum left upper eyelid: Secondary | ICD-10-CM | POA: Diagnosis not present

## 2022-10-11 DIAGNOSIS — E782 Mixed hyperlipidemia: Secondary | ICD-10-CM | POA: Diagnosis not present

## 2022-10-11 DIAGNOSIS — M810 Age-related osteoporosis without current pathological fracture: Secondary | ICD-10-CM | POA: Diagnosis not present

## 2022-10-11 DIAGNOSIS — G8929 Other chronic pain: Secondary | ICD-10-CM | POA: Diagnosis not present

## 2022-10-11 DIAGNOSIS — F32A Depression, unspecified: Secondary | ICD-10-CM | POA: Diagnosis not present

## 2022-10-11 DIAGNOSIS — M545 Low back pain, unspecified: Secondary | ICD-10-CM | POA: Diagnosis not present

## 2022-10-11 DIAGNOSIS — M797 Fibromyalgia: Secondary | ICD-10-CM | POA: Diagnosis not present

## 2022-10-11 DIAGNOSIS — I10 Essential (primary) hypertension: Secondary | ICD-10-CM | POA: Diagnosis not present

## 2022-10-11 DIAGNOSIS — M8580 Other specified disorders of bone density and structure, unspecified site: Secondary | ICD-10-CM | POA: Diagnosis not present

## 2022-10-19 ENCOUNTER — Ambulatory Visit (HOSPITAL_COMMUNITY): Payer: Medicare Other

## 2022-10-19 ENCOUNTER — Encounter (HOSPITAL_COMMUNITY): Payer: Self-pay

## 2022-10-19 ENCOUNTER — Ambulatory Visit (HOSPITAL_COMMUNITY)
Admission: RE | Admit: 2022-10-19 | Discharge: 2022-10-19 | Disposition: A | Discharge status: Home / Self Care | Payer: Medicare Other | Source: Ambulatory Visit | Attending: Internal Medicine | Admitting: Internal Medicine

## 2022-10-19 DIAGNOSIS — Z8673 Personal history of transient ischemic attack (TIA), and cerebral infarction without residual deficits: Secondary | ICD-10-CM | POA: Insufficient documentation

## 2022-10-19 DIAGNOSIS — I6782 Cerebral ischemia: Secondary | ICD-10-CM | POA: Insufficient documentation

## 2022-10-22 ENCOUNTER — Other Ambulatory Visit (HOSPITAL_COMMUNITY): Payer: Self-pay | Admitting: Internal Medicine

## 2022-10-22 DIAGNOSIS — Z8673 Personal history of transient ischemic attack (TIA), and cerebral infarction without residual deficits: Secondary | ICD-10-CM | POA: Diagnosis not present

## 2022-10-22 DIAGNOSIS — I10 Essential (primary) hypertension: Secondary | ICD-10-CM | POA: Diagnosis not present

## 2022-10-22 DIAGNOSIS — Z681 Body mass index (BMI) 19 or less, adult: Secondary | ICD-10-CM | POA: Diagnosis not present

## 2022-10-22 DIAGNOSIS — M797 Fibromyalgia: Secondary | ICD-10-CM | POA: Diagnosis not present

## 2022-10-22 DIAGNOSIS — M545 Low back pain, unspecified: Secondary | ICD-10-CM | POA: Diagnosis not present

## 2022-10-22 DIAGNOSIS — Z79899 Other long term (current) drug therapy: Secondary | ICD-10-CM | POA: Diagnosis not present

## 2022-10-22 DIAGNOSIS — Z791 Long term (current) use of non-steroidal anti-inflammatories (NSAID): Secondary | ICD-10-CM | POA: Diagnosis not present

## 2022-10-22 DIAGNOSIS — F32A Depression, unspecified: Secondary | ICD-10-CM | POA: Diagnosis not present

## 2022-10-22 DIAGNOSIS — Z713 Dietary counseling and surveillance: Secondary | ICD-10-CM | POA: Diagnosis not present

## 2022-11-05 ENCOUNTER — Ambulatory Visit (HOSPITAL_COMMUNITY)
Admission: RE | Admit: 2022-11-05 | Discharge: 2022-11-05 | Disposition: A | Discharge status: Home / Self Care | Payer: Medicare Other | Source: Ambulatory Visit | Attending: Internal Medicine | Admitting: Internal Medicine

## 2022-11-05 DIAGNOSIS — I639 Cerebral infarction, unspecified: Secondary | ICD-10-CM | POA: Diagnosis not present

## 2022-11-05 DIAGNOSIS — Z8673 Personal history of transient ischemic attack (TIA), and cerebral infarction without residual deficits: Secondary | ICD-10-CM | POA: Diagnosis not present

## 2022-11-05 DIAGNOSIS — I10 Essential (primary) hypertension: Secondary | ICD-10-CM | POA: Diagnosis not present

## 2022-11-05 MED ORDER — GADOBUTROL 1 MMOL/ML IV SOLN
5.0000 mL | Freq: Once | INTRAVENOUS | Status: AC | PRN
  Administered 2022-11-05: 5 mL via INTRAVENOUS

## 2023-01-02 ENCOUNTER — Encounter: Payer: Self-pay | Admitting: Neurology

## 2023-01-02 ENCOUNTER — Ambulatory Visit: Payer: Medicare Other | Admitting: Neurology

## 2023-01-02 ENCOUNTER — Telehealth: Payer: Self-pay | Admitting: Neurology

## 2023-01-02 VITALS — BP 144/77 | HR 88 | Ht 65.5 in | Wt 120.0 lb

## 2023-01-02 DIAGNOSIS — I63511 Cerebral infarction due to unspecified occlusion or stenosis of right middle cerebral artery: Secondary | ICD-10-CM

## 2023-01-02 DIAGNOSIS — I63532 Cerebral infarction due to unspecified occlusion or stenosis of left posterior cerebral artery: Secondary | ICD-10-CM | POA: Diagnosis not present

## 2023-01-02 MED ORDER — ROSUVASTATIN CALCIUM 20 MG PO TABS
20.0000 mg | ORAL_TABLET | Freq: Every evening | ORAL | 3 refills | Status: DC
Start: 2023-01-02 — End: 2023-01-08

## 2023-01-02 NOTE — Progress Notes (Signed)
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 She has 1 in 2017 and gave her aphasia.  Recently, in may left arm and hand were essentially paralyzed for approximately 2 days. On June 1st her whole body was frozen like paralyzed and it gradually came back to feeling normal. She had a terrible ha that lasted an entire 24 hours but was gone when she woke up the next morning    HISTORY OF PRESENT ILLNESS:  This is a 82 year old woman past medical history of hypertension, hyperlipidemia, history of TIA in 2017, when she presented with aphasia, anxiety and fibromyalgia who is presenting with symptoms concerning for TIA/stroke.   Patient reported in May 2024 she woke up with a left arm and hand paralysis, she could not use it. The symptoms lasted about 2 days when she regained function of the hand.  She did not seek any medical attention at that time.   On June 1, she woke up and could not move out of her bed.  She said after a few moments she was able to sit on the edge of the bed, she got up and fell.  She could not walk straight.  She reported feeling off the entire day and had a horrible headache and double vision; by the next morning her symptoms subsided.  Again for these two episodes she did not present to the ED.  She saw her PCP who ordered MRI brain and referral to neurology.  Her MRI brain showed 2 subacute strokes, 1 in the left occipital region and another 1 in the right frontoparietal vertex.  She is on aspirin 81 mg daily, does not take any statin.  No history of diabetes or heart disease.   OTHER MEDICAL CONDITIONS: Hypertension, Hyperlipidemia, TIA, Anxiety, Fibromyalgia    REVIEW OF SYSTEMS: Full 14  system review of systems performed and negative with exception of: As noted in the HPI   ALLERGIES: Allergies  Allergen Reactions   Lyrica [Pregabalin]     Dizzy and visual changes   Prednisone    Wound Dressing Adhesive     Bandaid adhesive    HOME MEDICATIONS: Outpatient Medications Prior to Visit  Medication Sig Dispense Refill   amLODipine (NORVASC) 5 MG tablet Take 1 tablet (5 mg total) by mouth daily. 90 tablet 1   aspirin 81 MG tablet Take 81 mg by mouth daily.     b complex vitamins capsule Take 1 capsule by mouth as needed.     BIOTIN PO Take 10,000 mcg by mouth.     Calcium Carbonate (CALCIUM 600 PO) Take by mouth.     cetirizine (ZYRTEC) 5 MG tablet Take 5 mg by mouth daily.     diclofenac (VOLTAREN) 75 MG EC tablet Take 1 tablet (75 mg total) by mouth as needed. 30 tablet 0   DULoxetine (CYMBALTA) 60 MG capsule Take 1 capsule (60 mg total) by mouth daily. 90 capsule 1   Magnesium 500 MG CAPS Take 500 mg by mouth daily.     Multiple Vitamins-Minerals (MULTIVITAMIN PO) Take 1 capsule by mouth daily.     omeprazole (PRILOSEC) 20 MG capsule Take 1 capsule (20 mg total) by mouth 2 (two) times daily  for 14 days. 28 capsule 0   polyethylene glycol (MIRALAX) 0.34 gm/ml SOLN Take 1 g/kg by mouth every 12 (twelve) hours.     Probiotic Product (PROBIOTIC DAILY PO) Take 1 capsule by mouth daily.     Turmeric 500 MG CAPS Take 1 capsule by mouth daily.     No facility-administered medications prior to visit.    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   Colon polyp    Colon polyps    Constipation    DJD (degenerative joint disease)    Fibromyalgia    Hamstring tear    Hearing loss 12/08/2021   Heart murmur    Helicobacter pylori gastritis 03/13/2022   Diagnosed at EGD-will treat with quadruple therapy    Insomnia    Kidney atrophy    Lumbar facet arthropathy    Medullary sponge kidney    Nephrolithiasis    Neuropathy    Osteopenia of lumbar spine    Paresthesia    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   Synovial cyst of popliteal space (Baker), left knee 12/08/2021   Thyroid disease    TIA (transient ischemic attack) 02/20/2016   Tubular adenoma    Varicose veins of other specified sites 12/08/2021    PAST SURGICAL HISTORY: Past Surgical History:  Procedure Laterality Date   AUGMENTATION MAMMAPLASTY     COLONOSCOPY     DILATION AND CURETTAGE OF UTERUS     EYE SURGERY     both cataracts   INCISION AND DRAINAGE ABSCESS Right 01/27/2013   Procedure: INCISION AND DRAINAGE RIGHT THUMB PULP;  Surgeon: Nicki Reaper, MD;  Location: [facility];  Service: Orthopedics;  Laterality: Right;   study- EMG:  2016   Resulted with radiculopathies but no neuropathy.   VEIN LIGATION      FAMILY HISTORY: Family History  Adopted: Yes  Problem Relation Age of Onset   Hyperlipidemia Mother    Osteoarthritis Mother    Diabetes Mother    Heart disease Mother    Depression Mother    Osteoarthritis Sister    Stroke Sister    Hypertension Sister    Hyperlipidemia Sister    Diabetes Sister    Osteoarthritis Sister    Heart disease Sister        x 4   Osteoarthritis Sister    Heart disease Sister    Heart disease Sister    Heart disease Sister    Osteoarthritis Brother    Lung cancer Brother        smoker   Heart disease Brother    Hypertension Brother    Hyperlipidemia Brother    Diabetes Brother    Heart disease Brother    Heart attack Brother    Hyperlipidemia Brother    Hypertension Brother    Osteoarthritis Brother    Heart disease Brother        x 3    Spina bifida Daughter    Colon cancer Neg Hx    Stomach cancer Neg Hx    Esophageal cancer Neg Hx     SOCIAL HISTORY: Social History   Socioeconomic  History   Marital status: Widowed    Spouse name:  Not on file   Number of children: 1   Years of education: Not on file   Highest education level: Not on file  Occupational History   Occupation: retired  Tobacco Use   Smoking status: Never    Passive exposure: Never   Smokeless tobacco: Never  Vaping Use   Vaping status: Never Used  Substance and Sexual Activity   Alcohol use: No    Alcohol/week: 0.0 standard drinks of alcohol   Drug use: No   Sexual activity: Not Currently    Birth control/protection: None  Other Topics Concern   Not on file  Social History Narrative   Marital status/children/pets: widowed (2015) 1 son and 3 daughters   Education/employment: some college.  Worked 30 yrs  for [employer].   Safety:      -Wears a bicycle helmet riding a bike: Yes     -smoke alarm in the home:Yes     - wears seatbelt: Yes     - Feels safe in their relationships: Yes   Never smoker no alcohol 2 caffeinated beverages a day no tobacco or drug use      Social Determinants of Health   Financial Resource Strain: Low Risk  (01/31/2022)   Overall Financial Resource Strain (CARDIA)    Difficulty of Paying Living Expenses: Not hard at all  Food Insecurity: No Food Insecurity (01/31/2022)   Hunger Vital Sign    Worried About Running Out of Food in the Last Year: Never true    Ran Out of Food in the Last Year: Never true  Transportation Needs: No Transportation Needs (01/31/2022)   PRAPARE - Administrator, Civil Service (Medical): No    Lack of Transportation (Non-Medical): No  Physical Activity: Insufficiently Active (01/31/2022)   Exercise Vital Sign    Days of Exercise per Week: 5 days    Minutes of Exercise per Session: 10 min  Stress: No Stress Concern Present (01/31/2022)   [employer] of Occupational Health - Occupational Stress Questionnaire    Feeling of Stress : Not at all  Social Connections: Moderately Integrated (01/31/2022)   Social Connection and  Isolation Panel [NHANES]    Frequency of Communication with Friends and Family: More than three times a week    Frequency of Social Gatherings with Friends and Family: More than three times a week    Attends Religious Services: More than 4 times per year    Active Member of [employer] or Organizations: Yes    Attends Banker Meetings: 1 to 4 times per year    Marital Status: Widowed  Intimate Partner Violence: Not At Risk (01/31/2022)   Humiliation, Afraid, Rape, and Kick questionnaire    Fear of Current or Ex-Partner: No    Emotionally Abused: No    Physically Abused: No    Sexually Abused: No    PHYSICAL EXAM  GENERAL EXAM/CONSTITUTIONAL: Vitals:  Vitals:   01/02/23 0933 01/02/23 0937  BP: (!) 157/72 (!) 144/77  Pulse: 88   Weight: 120 lb (54.4 kg)   Height: 5' 5.5" (1.664 m)    Body mass index is 19.67 kg/m. Wt Readings from Last 3 Encounters:  01/02/23 120 lb (54.4 kg)  03/06/22 126 lb (57.2 kg)  01/30/22 126 lb (57.2 kg)   Patient is in no distress; well developed, nourished and groomed; neck is supple  MUSCULOSKELETAL: Gait, strength, tone, movements noted in Neurologic exam below  NEUROLOGIC: MENTAL STATUS:      No data  to display         awake, alert, oriented to person, place and time recent and remote memory intact normal attention and concentration language fluent, comprehension intact, naming intact fund of knowledge appropriate  CRANIAL NERVE:  2nd, 3rd, 4th, 6th - Visual fields full to confrontation, ?right lower quadrantanopia,  extraocular muscles intact, no nystagmus 5th - facial sensation symmetric 7th - facial strength symmetric 8th - hearing intact 9th - palate elevates symmetrically, uvula midline 11th - shoulder shrug symmetric 12th - tongue protrusion midline  MOTOR:  normal bulk and tone, full strength in the BUE, BLE  SENSORY:  normal and symmetric to light touch  COORDINATION:  finger-nose-finger, fine finger  movements normal  REFLEXES:  deep tendon reflexes present and symmetric  GAIT/STATION:  normal   DIAGNOSTIC DATA (LABS, IMAGING, TESTING) - I reviewed patient records, labs, notes, testing and imaging myself where available.  Lab Results  Component Value Date   WBC 5.7 07/20/2019   HGB 12.3 07/20/2019   HCT 37 07/20/2019   PLT 348 07/20/2019      Component Value Date/Time   NA 138 08/31/2021 0000   K 4.1 08/31/2021 0000   CL 99 08/31/2021 0000   CO2 36 (A) 08/31/2021 0000   BUN 13 08/31/2021 0000   CREATININE 0.7 08/31/2021 0000   CALCIUM 10.2 08/31/2021 0000   ALBUMIN 4.4 08/31/2021 0000   AST 24 07/20/2019 0000   ALT 16 07/20/2019 0000   Lab Results  Component Value Date   CHOL 255 (A) 07/27/2020   HDL 113 (A) 07/27/2020   LDLCALC 120 07/27/2020   TRIG 133 07/27/2020   No results found for: "HGBA1C" No results found for: "VITAMINB12" No results found for: "TSH"  Last HgA1C 2 months ago 6.1 per patient   MRI Brain 11/05/2022 1. No acute  finding. 2. Late subacute infarction in the left occipital lobe with a small focus of hemosiderin deposition and some lingering regional enhancement. 3. Late subacute cortical infarction at the right frontoparietal vertex with some hemosiderin deposition and minimal lingering enhancement. 4. Marked chronic small-vessel ischemic changes elsewhere throughout the brain, progressive since 2017.    ASSESSMENT AND PLAN  82 y.o. year old female with vascular risk factors including hypertension, hyperlipidemia who is presenting after 2 episodes of left hand weakness, another 1 of generalized weakness, inability to walk and double vision and found to have 2 separate late subacute strokes in the right frontoparietal vertex and left occipital lobe.  Based on location, multiple vascular territories, I do suspect cardioembolic or large vessel disease.  Plan at the moment is to obtain a CT angiogram head and neck, echocardiogram, refer the  patient for a 30-day cardiac monitor and I will start her on statin with the aspirin.  I strongly advised her to go to the ER if she experiences any other neurological deficit.  She voiced understanding.  I will contact her to go over the results otherwise we will continue to follow-up with PCP and return sooner if worse.   1. 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    Dispense:  90 tablet    Refill:  3    Return in about 1 year (around 01/02/2024).    Windell Norfolk, MD 01/02/2023, 10:14 AM  [facility] [address] [place], [place] [address] [phone]

## 2023-01-02 NOTE — Telephone Encounter (Signed)
[employer] medicare NPR sent to [employer] [phone]

## 2023-01-02 NOTE — Patient Instructions (Addendum)
Continue with Aspirin 81 mg daily  Start Crestor 20 mg nightly  CTA head and neck  Echocardiogram  30 days cardiac monitoring  Continue to follow up with PCP  Return in a year or sooner if worse

## 2023-01-07 ENCOUNTER — Telehealth: Payer: Self-pay | Admitting: Neurology

## 2023-01-07 NOTE — Telephone Encounter (Signed)
Call to patient, no answer. Left message to return call.

## 2023-01-07 NOTE — Telephone Encounter (Signed)
Patient called in stated she is having symptoms she's believes are related to the medication   states she is tired (which is not like here), flushing, blurry vision, constipation (worse). She is a prediabetic and read that this can cause high blood sugar. She is wanting to know if there is another medication she could take to replace this that may not have as many side effects. Please advise

## 2023-01-08 MED ORDER — ROSUVASTATIN CALCIUM 10 MG PO TABS: 10.0000 mg | ORAL_TABLET | Freq: Every day | ORAL | 6 refills | Status: DC

## 2023-01-08 NOTE — Addendum Note (Signed)
Addended by: Ocie Doyne on: 01/08/2023 12:40 PM   Modules accepted: Orders

## 2023-01-08 NOTE — Telephone Encounter (Signed)
She can try decreasing to 10 mg daily for now and see if she tolerates that better. I sent a new rx to her pharmacy

## 2023-01-08 NOTE — Telephone Encounter (Signed)
Called patient back and she reports body aches, drowsiness and constipation (worse than normal) since starting crestor a week ago following appointment. Patient wanting to know if she should give the medication more time, lower the dose or change all together. I advised I would have to send to the work in provider for review.

## 2023-01-10 ENCOUNTER — Ambulatory Visit: Payer: Medicare Other | Admitting: Neurology

## 2023-01-14 ENCOUNTER — Ambulatory Visit (HOSPITAL_COMMUNITY)
Admission: RE | Admit: 2023-01-14 | Discharge: 2023-01-14 | Disposition: A | Discharge status: Home / Self Care | Payer: Medicare Other | Source: Ambulatory Visit | Attending: Neurology | Admitting: Neurology

## 2023-01-14 DIAGNOSIS — I63511 Cerebral infarction due to unspecified occlusion or stenosis of right middle cerebral artery: Secondary | ICD-10-CM | POA: Diagnosis present

## 2023-01-14 DIAGNOSIS — I63532 Cerebral infarction due to unspecified occlusion or stenosis of left posterior cerebral artery: Secondary | ICD-10-CM | POA: Insufficient documentation

## 2023-01-14 LAB — ECHOCARDIOGRAM COMPLETE
AR max vel: 1.58 cm2
AV Area VTI: 1.57 cm2
AV Area mean vel: 1.63 cm2
AV Mean grad: 7.5 mmHg
AV Peak grad: 12.5 mmHg
Ao pk vel: 1.77 m/s
Area-P 1/2: 1.96 cm2
S' Lateral: 2.75 cm

## 2023-01-14 NOTE — Progress Notes (Signed)
*  PRELIMINARY RESULTS* Echocardiogram 2D Echocardiogram has been performed.  Stacey Drain 01/14/2023, 10:15 AM

## 2023-01-17 DIAGNOSIS — I63511 Cerebral infarction due to unspecified occlusion or stenosis of right middle cerebral artery: Secondary | ICD-10-CM

## 2023-01-17 DIAGNOSIS — I63532 Cerebral infarction due to unspecified occlusion or stenosis of left posterior cerebral artery: Secondary | ICD-10-CM

## 2023-01-18 ENCOUNTER — Ambulatory Visit (HOSPITAL_COMMUNITY)
Admission: RE | Admit: 2023-01-18 | Discharge: 2023-01-18 | Disposition: A | Discharge status: Home / Self Care | Payer: Medicare Other | Source: Ambulatory Visit | Attending: Neurology | Admitting: Neurology

## 2023-01-18 ENCOUNTER — Other Ambulatory Visit: Payer: Self-pay | Admitting: Neurology

## 2023-01-18 DIAGNOSIS — I63532 Cerebral infarction due to unspecified occlusion or stenosis of left posterior cerebral artery: Secondary | ICD-10-CM | POA: Insufficient documentation

## 2023-01-18 DIAGNOSIS — I63511 Cerebral infarction due to unspecified occlusion or stenosis of right middle cerebral artery: Secondary | ICD-10-CM | POA: Diagnosis present

## 2023-01-18 LAB — POCT I-STAT CREATININE: Creatinine, Ser: 0.8 mg/dL (ref 0.44–1.00)

## 2023-01-18 MED ORDER — IOHEXOL 350 MG/ML SOLN
75.0000 mL | Freq: Once | INTRAVENOUS | Status: AC | PRN
  Administered 2023-01-18: 75 mL via INTRAVENOUS

## 2023-02-07 ENCOUNTER — Telehealth: Payer: Self-pay

## 2023-02-07 NOTE — Telephone Encounter (Signed)
-----   Message from [facility] sent at 02/06/2023  7:26 PM EDT ----- Please call and inform patient that angiogram show narrowing in some of the blood vessels in her head and no significant narrowing in her neck. Advise her to continue with daily aspirin and statin with her other medications.   Dr. Teresa Coombs

## 2023-02-07 NOTE — Telephone Encounter (Signed)
Pt has called Megan Lester, CMA back. Please call pt

## 2023-02-07 NOTE — Telephone Encounter (Signed)
Lvm 1st attempt to obtain results

## 2023-02-20 ENCOUNTER — Ambulatory Visit: Payer: Medicare Other | Attending: Neurology

## 2023-02-20 DIAGNOSIS — I63511 Cerebral infarction due to unspecified occlusion or stenosis of right middle cerebral artery: Secondary | ICD-10-CM

## 2023-02-20 DIAGNOSIS — I63532 Cerebral infarction due to unspecified occlusion or stenosis of left posterior cerebral artery: Secondary | ICD-10-CM

## 2023-03-13 ENCOUNTER — Ambulatory Visit
Admission: EM | Admit: 2023-03-13 | Discharge: 2023-03-13 | Disposition: A | Discharge status: Home / Self Care | Payer: Medicare Other

## 2023-03-13 ENCOUNTER — Ambulatory Visit: Payer: Medicare Other

## 2023-03-13 DIAGNOSIS — M25532 Pain in left wrist: Secondary | ICD-10-CM

## 2023-03-13 NOTE — ED Provider Notes (Signed)
[facility]    CSN: [id number] Arrival date & time: 03/13/23  2130      History   Chief Complaint No chief complaint on file.   HPI Megan Lester is a 82 y.o. female.   Patient presenting today with 1 day history of left hand and wrist pain, swelling after falling onto an outstretched hand yesterday while walking her dog.  She states she is able to move the area but significantly painful to do so.  Pain is slightly less today than it was yesterday.  Denies numbness, tingling, loss of range of motion, obvious deformities.  Taking over-the-counter pain relievers and using an Ace wrap with mild benefit.    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   Colon polyp    Colon polyps    Constipation    DJD (degenerative joint disease)    Fibromyalgia    Hamstring tear    Hearing loss 12/08/2021   Heart murmur    Helicobacter pylori gastritis 03/13/2022   Diagnosed at EGD-will treat with quadruple therapy   Insomnia    Kidney atrophy    Lumbar facet arthropathy    Medullary sponge kidney    Nephrolithiasis    Neuropathy    Osteopenia of lumbar spine    Paresthesia    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  Biopsied. 12/08/2021    Past Surgical History:  Procedure Laterality Date   AUGMENTATION MAMMAPLASTY     COLONOSCOPY     DILATION AND CURETTAGE OF UTERUS     EYE SURGERY     both cataracts   INCISION AND DRAINAGE ABSCESS Right 01/27/2013   Procedure: INCISION AND DRAINAGE RIGHT THUMB PULP;  Surgeon: Nicki Reaper, MD;  Location: [facility];  Service: Orthopedics;  Laterality: Right;   study- EMG:  2016   Resulted with radiculopathies but no neuropathy.   VEIN LIGATION      OB History     Gravida  1   Para  1   Term      Preterm      AB      Living  1      SAB      IAB      Ectopic      Multiple      Live Births               Home Medications  Prior to Admission medications   Medication Sig Start Date End Date Taking? Authorizing Provider  amLODipine (NORVASC) 10 MG tablet Take 10 mg by mouth daily. 01/16/23  Yes [provider]  amLODipine (NORVASC) 5 MG tablet Take 1 tablet (5 mg total) by mouth daily. 12/08/21   Kuneff, Renee A, DO  aspirin 81 MG tablet Take 81 mg by mouth daily.    [provider]  b complex vitamins capsule Take 1 capsule by mouth as needed.    [provider]  BIOTIN PO Take 10,000 mcg by mouth.    [provider]  Calcium Carbonate (CALCIUM 600 PO) Take by mouth.    [provider]  cetirizine (ZYRTEC) 5 MG tablet Take 5 mg by mouth daily.    [provider]  diclofenac (VOLTAREN) 75 MG EC tablet Take 1 tablet (75 mg total) by mouth as needed. 06/12/22   Kuneff, Renee A, DO  DULoxetine (CYMBALTA) 60 MG capsule Take 1 capsule (60 mg total) by mouth daily. 12/08/21   Kuneff, Renee A, DO  Magnesium  500 MG CAPS Take 500 mg by mouth daily.    [provider]  Multiple Vitamins-Minerals (MULTIVITAMIN PO) Take 1 capsule by mouth daily.    [provider]  omeprazole (PRILOSEC) 20 MG capsule Take 1 capsule (20 mg total) by mouth 2 (two) times daily for 14 days. 03/15/22 12/30/23  Iva Boop, MD  polyethylene glycol (MIRALAX) 0.34 gm/ml SOLN Take 1 g/kg by mouth every 12 (twelve) hours.    [provider]  Probiotic Product (PROBIOTIC DAILY PO) Take 1 capsule by mouth daily.    [provider]  rosuvastatin (CRESTOR) 10 MG tablet Take 1 tablet (10 mg total) by mouth daily. 01/08/23   Ocie Doyne, MD  Turmeric 500 MG CAPS Take 1 capsule by mouth daily.    [provider]    Family History Family History  Adopted: Yes  Problem Relation Age of Onset   Hyperlipidemia Mother    Osteoarthritis Mother    Diabetes Mother    Heart disease Mother    Depression Mother    Osteoarthritis Sister    Stroke Sister    Hypertension Sister    Hyperlipidemia Sister    Diabetes Sister    Osteoarthritis Sister    Heart disease Sister        x 4   Osteoarthritis Sister    Heart disease Sister    Heart disease Sister    Heart disease Sister    Osteoarthritis Brother    Lung cancer Brother        smoker   Heart disease Brother    Hypertension Brother    Hyperlipidemia Brother    Diabetes Brother    Heart disease Brother    Heart attack Brother    Hyperlipidemia Brother    Hypertension Brother    Osteoarthritis Brother    Heart disease Brother        x 3    Spina bifida Daughter    Colon cancer Neg Hx    Stomach cancer Neg Hx    Esophageal cancer Neg Hx     Social History Social History   Tobacco Use   Smoking status: Never    Passive exposure: Never   Smokeless tobacco: Never  Vaping Use   Vaping status: Never Used  Substance Use Topics   Alcohol use: No    Alcohol/week: 0.0 standard drinks of alcohol   Drug use: No  Allergies   Lyrica [pregabalin], Prednisone, and Wound dressing adhesive   Review of Systems Review of Systems Per HPI  Physical Exam Triage Vital Signs ED Triage Vitals  Encounter Vitals Group     BP 03/13/23 0836 115/69     Systolic BP Percentile --      Diastolic BP Percentile --      Pulse Rate 03/13/23 0836 (!) 59     Resp 03/13/23 0836 16     Temp 03/13/23 0836 97.7 F (36.5 C)     Temp Source 03/13/23 0836 Oral     SpO2 03/13/23 0836 96 %     Weight --      Height --      Head Circumference --      Peak Flow --      Pain Score 03/13/23 0838 6     Pain Loc --      Pain Education --      Exclude from Growth Chart --    No data found.  Updated Vital Signs BP 115/69 (BP Location: Right Arm)   Pulse (!) 59   Temp 97.7 F (36.5 C) (Oral)   Resp 16   SpO2 96%   Visual Acuity Right Eye Distance:   Left Eye Distance:   Bilateral Distance:    Right Eye Near:   Left Eye Near:    Bilateral Near:     Physical Exam Vitals and nursing note reviewed.  Constitutional:      Appearance: Normal appearance. She is not ill-appearing.  HENT:     Head: Atraumatic.  Eyes:     Extraocular Movements: Extraocular movements intact.     Conjunctiva/sclera: Conjunctivae normal.  Cardiovascular:     Rate and Rhythm: Normal rate.  Pulmonary:     Effort: Pulmonary effort is normal.  Musculoskeletal:        General: Swelling, tenderness and signs of injury present. No deformity. Normal range of motion.     Cervical back: Normal range of motion and neck supple.     Comments: Mild edema, bruising, tenderness to palpation over the radial aspect of left wrist.  Range of motion intact but painful per patient.  Grip strength full and equal.  Skin:    General: Skin is warm and dry.     Findings: Bruising present. No erythema.  Neurological:     Mental Status: She is alert and oriented to person, place, and time.     Comments: Left upper extremity neurovascularly intact   Psychiatric:        Mood and Affect: Mood normal.        Thought Content: Thought content normal.        Judgment: Judgment normal.      UC Treatments / Results  Labs (all labs ordered are listed, but only abnormal results are displayed) Labs Reviewed - No data to display  EKG   Radiology DG Wrist Complete Left  Result Date: 03/13/2023 CLINICAL DATA:  Larey Seat over dog.  Swelling in left wrist and hand. EXAM: LEFT WRIST - COMPLETE 3+ VIEW COMPARISON:  None Available. FINDINGS: There is diffuse decreased bone mineralization. Moderate radial-scaphoid joint space narrowing. Mild right subchondral cystic change at the proximal lunate articulation with the triquetrum. Mild triscaphe and thumb carpometacarpal joint space narrowing. Tiny calcific density dorsal to the lunate on lateral view, nonspecific. No definite acute fracture is seen.  No dislocation. IMPRESSION: 1. No definite acute fracture is seen. 2. 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 3.  Mild triscaphe and thumb carpometacarpal osteoarthritis. Electronically Signed   By: Neita Garnet M.D.   On: 03/13/2023 11:31    Procedures Procedures (including critical care time)  Medications Ordered in UC Medications - No data to display  Initial Impression / Assessment and Plan / UC Course  I have reviewed the triage vital signs and the nursing notes.  Pertinent labs & imaging results that were available during my care of the patient were reviewed by me and considered in my medical decision making (see chart for details).     X-ray today showing no acute fracture of the left wrist.  Ace wrap applied, discussed RICE protocol, over-the-counter pain relievers as needed.  Follow-up for worsening symptoms.  Final Clinical Impressions(s) / UC Diagnoses   Final diagnoses:  Left wrist pain     Discharge Instructions      Continue Ace wrap, ice, elevation, Tylenol as needed.  I will call you if anything abnormal comes back on  your x-ray.    ED Prescriptions   None    PDMP not reviewed this encounter.   Particia Nearing, [place] 03/13/23 1149

## 2023-03-13 NOTE — ED Triage Notes (Signed)
Pt c/o left hand injury due to fall that occurred yesterday. Swelling is present in the left hand. Fell over he golden doodle. Pain is in the wrist and the hand.

## 2023-03-13 NOTE — Discharge Instructions (Signed)
Continue Ace wrap, ice, elevation, Tylenol as needed.  I will call you if anything abnormal comes back on your x-ray.

## 2023-07-15 DIAGNOSIS — Z85828 Personal history of other malignant neoplasm of skin: Secondary | ICD-10-CM | POA: Diagnosis not present

## 2023-07-15 DIAGNOSIS — L821 Other seborrheic keratosis: Secondary | ICD-10-CM | POA: Diagnosis not present

## 2023-07-15 DIAGNOSIS — L812 Freckles: Secondary | ICD-10-CM | POA: Diagnosis not present

## 2023-07-15 DIAGNOSIS — L570 Actinic keratosis: Secondary | ICD-10-CM | POA: Diagnosis not present

## 2023-07-15 DIAGNOSIS — D044 Carcinoma in situ of skin of scalp and neck: Secondary | ICD-10-CM | POA: Diagnosis not present

## 2023-07-15 DIAGNOSIS — C44719 Basal cell carcinoma of skin of left lower limb, including hip: Secondary | ICD-10-CM | POA: Diagnosis not present

## 2023-07-15 DIAGNOSIS — D485 Neoplasm of uncertain behavior of skin: Secondary | ICD-10-CM | POA: Diagnosis not present

## 2023-07-15 DIAGNOSIS — D1801 Hemangioma of skin and subcutaneous tissue: Secondary | ICD-10-CM | POA: Diagnosis not present

## 2023-08-12 ENCOUNTER — Other Ambulatory Visit: Payer: Self-pay | Admitting: *Deleted

## 2023-08-12 MED ORDER — ROSUVASTATIN CALCIUM 10 MG PO TABS: 10.0000 mg | ORAL_TABLET | Freq: Every day | ORAL | 6 refills | Status: DC

## 2023-08-12 NOTE — Telephone Encounter (Signed)
 Last seen on 01/02/23  Follow up scheduled on 01/08/24 See 01/07/23 telephone encounter

## 2023-10-29 DIAGNOSIS — M25551 Pain in right hip: Secondary | ICD-10-CM | POA: Diagnosis not present

## 2023-10-29 DIAGNOSIS — M545 Low back pain, unspecified: Secondary | ICD-10-CM | POA: Diagnosis not present

## 2023-10-31 DIAGNOSIS — R7303 Prediabetes: Secondary | ICD-10-CM | POA: Diagnosis not present

## 2023-10-31 DIAGNOSIS — E782 Mixed hyperlipidemia: Secondary | ICD-10-CM | POA: Diagnosis not present

## 2023-11-06 DIAGNOSIS — M810 Age-related osteoporosis without current pathological fracture: Secondary | ICD-10-CM | POA: Diagnosis not present

## 2023-11-06 DIAGNOSIS — W19XXXA Unspecified fall, initial encounter: Secondary | ICD-10-CM | POA: Diagnosis not present

## 2023-11-06 DIAGNOSIS — K5909 Other constipation: Secondary | ICD-10-CM | POA: Diagnosis not present

## 2023-11-06 DIAGNOSIS — R7303 Prediabetes: Secondary | ICD-10-CM | POA: Diagnosis not present

## 2023-11-06 DIAGNOSIS — I63511 Cerebral infarction due to unspecified occlusion or stenosis of right middle cerebral artery: Secondary | ICD-10-CM | POA: Diagnosis not present

## 2023-11-06 DIAGNOSIS — E782 Mixed hyperlipidemia: Secondary | ICD-10-CM | POA: Diagnosis not present

## 2023-11-06 DIAGNOSIS — M545 Low back pain, unspecified: Secondary | ICD-10-CM | POA: Diagnosis not present

## 2023-11-06 DIAGNOSIS — I10 Essential (primary) hypertension: Secondary | ICD-10-CM | POA: Diagnosis not present

## 2023-11-06 DIAGNOSIS — M797 Fibromyalgia: Secondary | ICD-10-CM | POA: Diagnosis not present

## 2023-11-06 DIAGNOSIS — K2970 Gastritis, unspecified, without bleeding: Secondary | ICD-10-CM | POA: Diagnosis not present

## 2023-11-06 DIAGNOSIS — I63532 Cerebral infarction due to unspecified occlusion or stenosis of left posterior cerebral artery: Secondary | ICD-10-CM | POA: Diagnosis not present

## 2023-11-06 DIAGNOSIS — Z8673 Personal history of transient ischemic attack (TIA), and cerebral infarction without residual deficits: Secondary | ICD-10-CM | POA: Diagnosis not present

## 2024-01-07 DIAGNOSIS — L812 Freckles: Secondary | ICD-10-CM | POA: Diagnosis not present

## 2024-01-07 DIAGNOSIS — L570 Actinic keratosis: Secondary | ICD-10-CM | POA: Diagnosis not present

## 2024-01-07 DIAGNOSIS — L821 Other seborrheic keratosis: Secondary | ICD-10-CM | POA: Diagnosis not present

## 2024-01-07 DIAGNOSIS — Z85828 Personal history of other malignant neoplasm of skin: Secondary | ICD-10-CM | POA: Diagnosis not present

## 2024-01-08 ENCOUNTER — Ambulatory Visit: Payer: Medicare Other | Admitting: Neurology

## 2024-01-22 ENCOUNTER — Encounter: Payer: Self-pay | Admitting: Neurology

## 2024-01-22 ENCOUNTER — Ambulatory Visit: Admitting: Neurology

## 2024-01-22 VITALS — BP 126/66 | HR 64 | Ht 65.5 in | Wt 120.4 lb

## 2024-01-22 DIAGNOSIS — G629 Polyneuropathy, unspecified: Secondary | ICD-10-CM

## 2024-01-22 DIAGNOSIS — I63532 Cerebral infarction due to unspecified occlusion or stenosis of left posterior cerebral artery: Secondary | ICD-10-CM

## 2024-01-22 DIAGNOSIS — I63511 Cerebral infarction due to unspecified occlusion or stenosis of right middle cerebral artery: Secondary | ICD-10-CM | POA: Diagnosis not present

## 2024-01-22 NOTE — Progress Notes (Signed)
 [facility]  PATIENT: Megan Lester DOB: Aug 17, 1940  REQUESTING CLINICIAN: Shona Norleen PEDLAR, MD HISTORY FROM: Patient  REASON FOR VISIT: Left arm weakness/concerns for TIA    HISTORICAL  CHIEF COMPLAINT:  Chief Complaint  Patient presents with   Follow-up    Pt in room 13. Alone. Here for stroke follow up. Patient reports doing well, pt wants discuss neuropathy in feet. Tingling/burning sensations in feet.   INTERVAL HISTORY 01/22/2024: Patient presents today for follow-up, last visit was a year ago, since then she tells me she has been doing well.  Her angiogram has been completed, no severe stenosis, echocardiogram showed EF of 55 to 60% and her cardiac monitor did not show any arrhythmia.  She tells me she has not had any new symptoms concerning for strokes.  Overall she is doing well, she does exercise, compliant with her medications, but reports new neuropathic symptoms that she describes as constant tingling and burning pain at night.  She has not tried an additional medication for neuropathy.   HISTORY OF PRESENT ILLNESS:  This is a 83 year old woman past medical history of hypertension, hyperlipidemia, history of TIA in 2017, when she presented with aphasia, anxiety and fibromyalgia who is presenting with symptoms concerning for TIA/stroke.   Patient reported in May 2024 she woke up with a left arm and hand paralysis, she could not use it. The symptoms lasted about 2 days when she regained function of the hand.  She did not seek any medical attention at that time.   On June 1, she woke up and could not move out of her bed.  She said after a few moments she was able to sit on the edge of the bed, she got up and fell.  She could not walk straight.  She reported feeling off the entire day and had a horrible headache and double vision; by the next morning her symptoms subsided.  Again for these two episodes she did not present to the ED.  She saw her PCP who ordered  MRI brain and referral to neurology.  Her MRI brain showed 2 subacute strokes, 1 in the left occipital region and another 1 in the right frontoparietal vertex.  She is on aspirin 81 mg daily, does not take any statin.  No history of diabetes or heart disease.   OTHER MEDICAL CONDITIONS: Hypertension, Hyperlipidemia, TIA, Anxiety, Fibromyalgia    REVIEW OF SYSTEMS: Full 14 system review of systems performed and negative with exception of: As noted in the HPI   ALLERGIES: Allergies  Allergen Reactions   Lyrica [Pregabalin]     Dizzy and visual changes   Prednisone    Wound Dressing Adhesive     Bandaid adhesive    HOME MEDICATIONS: Outpatient Medications Prior to Visit  Medication Sig Dispense Refill   amLODipine  (NORVASC ) 10 MG tablet Take 10 mg by mouth daily.     aspirin 81 MG tablet Take 81 mg by mouth daily.     b complex vitamins capsule Take 1 capsule by mouth as needed.     BIOTIN PO Take 10,000 mcg by mouth.     Calcium  Carbonate (CALCIUM  600 PO) Take by mouth.     cetirizine (ZYRTEC) 5 MG tablet Take 5 mg by mouth daily.     diclofenac  (VOLTAREN ) 75 MG EC tablet Take 1 tablet (75 mg total) by mouth as needed. 30 tablet 0   Magnesium 500 MG CAPS Take 500 mg by mouth daily.  Multiple Vitamins-Minerals (MULTIVITAMIN PO) Take 1 capsule by mouth daily.     polyethylene glycol (MIRALAX) 0.34 gm/ml SOLN Take 1 g/kg by mouth every 12 (twelve) hours.     Probiotic Product (PROBIOTIC DAILY PO) Take 1 capsule by mouth daily.     rosuvastatin  (CRESTOR ) 10 MG tablet Take 1 tablet (10 mg total) by mouth daily. 30 tablet 6   Turmeric 500 MG CAPS Take 1 capsule by mouth daily.     amLODipine  (NORVASC ) 5 MG tablet Take 1 tablet (5 mg total) by mouth daily. 90 tablet 1   DULoxetine  (CYMBALTA ) 60 MG capsule Take 1 capsule (60 mg total) by mouth daily. 90 capsule 1   omeprazole  (PRILOSEC) 20 MG capsule Take 1 capsule (20 mg total) by mouth 2 (two) times daily for 14 days. 28 capsule 0    No facility-administered medications prior to visit.    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   Colon polyp    Colon polyps    Constipation    DJD (degenerative joint disease)    Fibromyalgia    Hamstring tear    Hearing loss 12/08/2021   Heart murmur    Helicobacter pylori gastritis 03/13/2022   Diagnosed at EGD-will treat with quadruple therapy   Insomnia    Kidney atrophy    Lumbar facet arthropathy    Medullary sponge kidney    Nephrolithiasis    Neuropathy    Osteopenia of lumbar spine    Paresthesia    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   Synovial cyst of popliteal space (Baker), left knee 12/08/2021   Thyroid  disease    TIA (transient ischemic attack) 02/20/2016   Tubular adenoma    Varicose veins of other specified sites 12/08/2021    PAST SURGICAL HISTORY: Past Surgical History:  Procedure Laterality Date   AUGMENTATION MAMMAPLASTY     COLONOSCOPY     DILATION AND CURETTAGE OF UTERUS     EYE SURGERY     both cataracts   INCISION AND DRAINAGE ABSCESS Right 01/27/2013   Procedure: INCISION AND DRAINAGE RIGHT THUMB PULP;  Surgeon: Arley JONELLE Curia, MD;  Location: [facility];  Service: Orthopedics;  Laterality: Right;   study- EMG:  2016   Resulted with radiculopathies but no neuropathy.   VEIN LIGATION      FAMILY HISTORY: Family History  Adopted: Yes  Problem Relation Age of Onset   Hyperlipidemia Mother    Osteoarthritis Mother    Diabetes Mother    Heart disease Mother    Depression Mother    Osteoarthritis Sister    Stroke Sister    Hypertension Sister    Hyperlipidemia Sister    Diabetes Sister    Osteoarthritis Sister    Heart disease Sister        x 4   Osteoarthritis Sister    Heart disease Sister     Heart disease Sister    Heart disease Sister    Osteoarthritis Brother    Lung cancer Brother        smoker   Heart disease Brother    Hypertension Brother    Hyperlipidemia Brother    Diabetes Brother  Heart disease Brother    Heart attack Brother    Hyperlipidemia Brother    Hypertension Brother    Osteoarthritis Brother    Heart disease Brother        x 3    Spina bifida Daughter    Colon cancer Neg Hx    Stomach cancer Neg Hx    Esophageal cancer Neg Hx     SOCIAL HISTORY: Social History   Socioeconomic History   Marital status: Widowed    Spouse name: Not on file   Number of children: 1   Years of education: Not on file   Highest education level: Not on file  Occupational History   Occupation: retired  Tobacco Use   Smoking status: Never    Passive exposure: Never   Smokeless tobacco: Never  Vaping Use   Vaping status: Never Used  Substance and Sexual Activity   Alcohol use: No    Alcohol/week: 0.0 standard drinks of alcohol   Drug use: No   Sexual activity: Not Currently    Birth control/protection: None  Other Topics Concern   Not on file  Social History Narrative   Marital status/children/pets: widowed (2015) 1 son and 3 daughters   Education/employment: some college.  Worked 30 yrs  for [employer].   Safety:      -Wears a bicycle helmet riding a bike: Yes     -smoke alarm in the home:Yes     - wears seatbelt: Yes     - Feels safe in their relationships: Yes   Never smoker no alcohol 2 caffeinated beverages a day no tobacco or drug use      Social Drivers of Corporate investment banker Strain: Low Risk  (01/31/2022)   Overall Financial Resource Strain (CARDIA)    Difficulty of Paying Living Expenses: Not hard at all  Food Insecurity: No Food Insecurity (01/31/2022)   Hunger Vital Sign    Worried About Running Out of Food in the Last Year: Never true    Ran Out of Food in the Last Year: Never true  Transportation Needs: No  Transportation Needs (01/31/2022)   PRAPARE - Administrator, Civil Service (Medical): No    Lack of Transportation (Non-Medical): No  Physical Activity: Insufficiently Active (01/31/2022)   Exercise Vital Sign    Days of Exercise per Week: 5 days    Minutes of Exercise per Session: 10 min  Stress: No Stress Concern Present (01/31/2022)   [employer] of Occupational Health - Occupational Stress Questionnaire    Feeling of Stress : Not at all  Social Connections: Moderately Integrated (01/31/2022)   Social Connection and Isolation Panel    Frequency of Communication with Friends and Family: More than three times a week    Frequency of Social Gatherings with Friends and Family: More than three times a week    Attends Religious Services: More than 4 times per year    Active Member of [employer] or Organizations: Yes    Attends Banker Meetings: 1 to 4 times per year    Marital Status: Widowed  Intimate Partner Violence: Not At Risk (01/31/2022)   Humiliation, Afraid, Rape, and Kick questionnaire    Fear of Current or Ex-Partner: No    Emotionally Abused: No    Physically Abused: No    Sexually Abused: No    PHYSICAL EXAM  GENERAL EXAM/CONSTITUTIONAL: Vitals:  Vitals:   01/22/24 1323  BP: 126/66  Pulse: 64  Weight:  120 lb 6.4 oz (54.6 kg)  Height: 5' 5.5 (1.664 m)   Body mass index is 19.73 kg/m. Wt Readings from Last 3 Encounters:  01/22/24 120 lb 6.4 oz (54.6 kg)  01/02/23 120 lb (54.4 kg)  03/06/22 126 lb (57.2 kg)   Patient is in no distress; well developed, nourished and groomed; neck is supple  MUSCULOSKELETAL: Gait, strength, tone, movements noted in Neurologic exam below  NEUROLOGIC: MENTAL STATUS:      No data to display         awake, alert, oriented to person, place and time recent and remote memory intact normal attention and concentration language fluent, comprehension intact, naming intact fund of knowledge  appropriate  CRANIAL NERVE:  2nd, 3rd, 4th, 6th - Visual fields full to confrontation, ?right lower quadrantanopia,  extraocular muscles intact, no nystagmus 5th - facial sensation symmetric 7th - facial strength symmetric 8th - hearing intact 9th - palate elevates symmetrically, uvula midline 11th - shoulder shrug symmetric 12th - tongue protrusion midline  MOTOR:  normal bulk and tone, full strength in the BUE, BLE  SENSORY:  normal and symmetric to light touch  COORDINATION:  finger-nose-finger, fine finger movements normal  GAIT/STATION:  normal   DIAGNOSTIC DATA (LABS, IMAGING, TESTING) - I reviewed patient records, labs, notes, testing and imaging myself where available.  Lab Results  Component Value Date   WBC 5.7 07/20/2019   HGB 12.3 07/20/2019   HCT 37 07/20/2019   PLT 348 07/20/2019      Component Value Date/Time   NA 138 08/31/2021 0000   K 4.1 08/31/2021 0000   CL 99 08/31/2021 0000   CO2 36 (A) 08/31/2021 0000   BUN 13 08/31/2021 0000   CREATININE 0.80 01/18/2023 1108   CALCIUM  10.2 08/31/2021 0000   ALBUMIN 4.4 08/31/2021 0000   AST 24 07/20/2019 0000   ALT 16 07/20/2019 0000   Lab Results  Component Value Date   CHOL 255 (A) 07/27/2020   HDL 113 (A) 07/27/2020   LDLCALC 120 07/27/2020   TRIG 133 07/27/2020   No results found for: HGBA1C No results found for: VITAMINB12 No results found for: TSH  Last HgA1C 2 months ago 6.1 per patient   MRI Brain 11/05/2022 1. No acute  finding. 2. Late subacute infarction in the left occipital lobe with a small focus of hemosiderin deposition and some lingering regional enhancement. 3. Late subacute cortical infarction at the right frontoparietal vertex with some hemosiderin deposition and minimal lingering enhancement. 4. Marked chronic small-vessel ischemic changes elsewhere throughout the brain, progressive since 2017.  CTA Head and Neck 02/05/2023 1. No evidence of acute or interval  infarction. 2. Motion degraded CTA of the neck. Less than typical atheromatous change for age with no significant stenosis of major arteries. 3. Generalized and more pronounced intracranial atherosclerosis although no proximal flow reducing stenosis.  Echocardiogram 01/14/2023 1. Left ventricular ejection fraction, by estimation, is 55 to 60%. The left ventricle has normal function. The left ventricle has no regional wall motion abnormalities. There is mild left ventricular hypertrophy.  Left ventricular diastolic parameters are consistent with Grade I diastolic dysfunction (impaired relaxation).  2. Right ventricular systolic function is normal. The right ventricular size is normal. There is normal pulmonary artery systolic pressure.  3. Left atrial size was mildly dilated.  4. Right atrial size was mildly dilated.  5. The mitral valve is abnormal. Mild mitral valve regurgitation. No evidence of mitral stenosis.  6. The tricuspid valve is  abnormal. Tricuspid valve regurgitation is moderate.  7. The aortic valve is tricuspid. There is mild calcification of the aortic valve. There is mild thickening of the aortic valve. Aortic valve regurgitation is not visualized. No aortic stenosis is present.  8. The inferior vena cava is normal in size with <50% respiratory variability, suggesting right atrial pressure of 8 mmHg.   ASSESSMENT AND PLAN  83 y.o. year old female with vascular risk factors including hypertension, hyperlipidemia who is presenting for follow-up for her previous stroke.  Her workup has been completed, angiogram with no severe severe stenosis, echocardiogram with EF 55 to 60% and cardiac monitor with no signs of arrhythmia.  She is compliant with medication including aspirin, amlodipine , rosuvastatin .  Plan for patient to continue current medication For her complaint of neuropathy, will recommend patient to take B12, alpha lipoic acid and lidocaine  cream since she does not want any  prescribed medication.  Continue to follow with PCP and return as needed.   1. 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 Peripheral polyneuropathy     Patient Instructions  Continue current medications Consider B12 supplement, alpha lipoic acid and lidocaine  cream for the neuropathy Continue follow-up with doctors Return as needed.  No orders of the defined types were placed in this encounter.   No orders of the defined types were placed in this encounter.   Return if symptoms worsen or fail to improve.    Pastor Falling, MD 01/22/2024, 2:06 PM  [facility] [address], [address] [place], [place] [address] [phone]

## 2024-01-22 NOTE — Patient Instructions (Addendum)
 Continue current medications Consider B12 supplement, alpha lipoic acid and lidocaine  cream for the neuropathy Continue follow-up with doctors Return as needed.

## 2024-02-06 ENCOUNTER — Telehealth: Payer: Self-pay

## 2024-02-06 ENCOUNTER — Other Ambulatory Visit: Payer: Self-pay | Admitting: Neurology

## 2024-02-06 NOTE — Telephone Encounter (Signed)
 My chart message sent
